# Patient Record
Sex: Female | Born: 1945 | Race: Black or African American | Hispanic: No | Marital: Single | Smoking: Former smoker
Health system: Southern US, Community
[De-identification: ages and names within clinical notes are randomized; demographics above are authoritative.]

## PROBLEM LIST (undated history)

## (undated) DIAGNOSIS — M1990 Unspecified osteoarthritis, unspecified site: Secondary | ICD-10-CM

## (undated) DIAGNOSIS — E119 Type 2 diabetes mellitus without complications: Secondary | ICD-10-CM

## (undated) DIAGNOSIS — E669 Obesity, unspecified: Secondary | ICD-10-CM

## (undated) DIAGNOSIS — D649 Anemia, unspecified: Secondary | ICD-10-CM

## (undated) HISTORY — PX: GASTRIC BYPASS: SHX52

## (undated) HISTORY — PX: BREAST BIOPSY: SHX20

## (undated) HISTORY — PX: REDUCTION MAMMAPLASTY: SUR839

## (undated) HISTORY — DX: Obesity, unspecified: E66.9

## (undated) HISTORY — PX: DILATION AND CURETTAGE OF UTERUS: SHX78

## (undated) HISTORY — PX: BREAST SURGERY: SHX581

---

## 1998-12-29 ENCOUNTER — Other Ambulatory Visit: Admission: RE | Admit: 1998-12-29 | Discharge: 1998-12-29 | Payer: Self-pay | Admitting: *Deleted

## 1999-02-12 ENCOUNTER — Ambulatory Visit (HOSPITAL_COMMUNITY): Admission: RE | Admit: 1999-02-12 | Discharge: 1999-02-12 | Payer: Self-pay | Admitting: *Deleted

## 1999-10-19 ENCOUNTER — Other Ambulatory Visit: Admission: RE | Admit: 1999-10-19 | Discharge: 1999-10-19 | Payer: Self-pay | Admitting: Internal Medicine

## 1999-12-07 ENCOUNTER — Encounter: Payer: Self-pay | Admitting: Internal Medicine

## 1999-12-07 ENCOUNTER — Encounter: Admission: RE | Admit: 1999-12-07 | Discharge: 1999-12-07 | Payer: Self-pay | Admitting: Internal Medicine

## 2000-10-18 ENCOUNTER — Other Ambulatory Visit: Admission: RE | Admit: 2000-10-18 | Discharge: 2000-10-18 | Payer: Self-pay | Admitting: Internal Medicine

## 2001-03-01 ENCOUNTER — Encounter: Payer: Self-pay | Admitting: Internal Medicine

## 2001-03-01 ENCOUNTER — Encounter: Admission: RE | Admit: 2001-03-01 | Discharge: 2001-03-01 | Payer: Self-pay | Admitting: Internal Medicine

## 2002-03-05 ENCOUNTER — Encounter: Payer: Self-pay | Admitting: Internal Medicine

## 2002-03-05 ENCOUNTER — Encounter: Admission: RE | Admit: 2002-03-05 | Discharge: 2002-03-05 | Payer: Self-pay | Admitting: Internal Medicine

## 2002-11-29 ENCOUNTER — Other Ambulatory Visit: Admission: RE | Admit: 2002-11-29 | Discharge: 2002-11-29 | Payer: Self-pay | Admitting: Internal Medicine

## 2002-12-05 ENCOUNTER — Encounter: Payer: Self-pay | Admitting: Internal Medicine

## 2002-12-05 ENCOUNTER — Encounter: Admission: RE | Admit: 2002-12-05 | Discharge: 2002-12-05 | Payer: Self-pay | Admitting: Internal Medicine

## 2002-12-12 ENCOUNTER — Encounter: Payer: Self-pay | Admitting: Internal Medicine

## 2002-12-12 ENCOUNTER — Encounter: Admission: RE | Admit: 2002-12-12 | Discharge: 2002-12-12 | Payer: Self-pay | Admitting: Internal Medicine

## 2003-02-06 ENCOUNTER — Ambulatory Visit (HOSPITAL_COMMUNITY): Admission: RE | Admit: 2003-02-06 | Discharge: 2003-02-06 | Payer: Self-pay | Admitting: Internal Medicine

## 2003-02-06 ENCOUNTER — Encounter: Payer: Self-pay | Admitting: Internal Medicine

## 2003-03-11 ENCOUNTER — Encounter: Payer: Self-pay | Admitting: Internal Medicine

## 2003-03-11 ENCOUNTER — Encounter: Admission: RE | Admit: 2003-03-11 | Discharge: 2003-03-11 | Payer: Self-pay | Admitting: Internal Medicine

## 2004-02-19 ENCOUNTER — Encounter (INDEPENDENT_AMBULATORY_CARE_PROVIDER_SITE_OTHER): Payer: Self-pay | Admitting: Specialist

## 2004-02-19 ENCOUNTER — Ambulatory Visit (HOSPITAL_COMMUNITY): Admission: RE | Admit: 2004-02-19 | Discharge: 2004-02-19 | Payer: Self-pay | Admitting: Gastroenterology

## 2004-03-17 ENCOUNTER — Encounter: Admission: RE | Admit: 2004-03-17 | Discharge: 2004-03-17 | Payer: Self-pay | Admitting: Internal Medicine

## 2004-04-16 ENCOUNTER — Encounter: Admission: RE | Admit: 2004-04-16 | Discharge: 2004-04-16 | Payer: Self-pay | Admitting: Internal Medicine

## 2004-04-26 ENCOUNTER — Encounter: Admission: RE | Admit: 2004-04-26 | Discharge: 2004-04-26 | Payer: Self-pay | Admitting: Internal Medicine

## 2005-01-18 ENCOUNTER — Other Ambulatory Visit: Admission: RE | Admit: 2005-01-18 | Discharge: 2005-01-18 | Payer: Self-pay | Admitting: Internal Medicine

## 2005-03-25 ENCOUNTER — Encounter: Admission: RE | Admit: 2005-03-25 | Discharge: 2005-03-25 | Payer: Self-pay | Admitting: Internal Medicine

## 2006-05-26 ENCOUNTER — Encounter: Admission: RE | Admit: 2006-05-26 | Discharge: 2006-05-26 | Payer: Self-pay | Admitting: Internal Medicine

## 2007-02-13 ENCOUNTER — Other Ambulatory Visit: Admission: RE | Admit: 2007-02-13 | Discharge: 2007-02-13 | Payer: Self-pay | Admitting: Internal Medicine

## 2007-03-17 ENCOUNTER — Emergency Department (HOSPITAL_COMMUNITY): Admission: EM | Admit: 2007-03-17 | Discharge: 2007-03-18 | Payer: Self-pay | Admitting: Emergency Medicine

## 2007-06-07 ENCOUNTER — Ambulatory Visit (HOSPITAL_BASED_OUTPATIENT_CLINIC_OR_DEPARTMENT_OTHER): Admission: RE | Admit: 2007-06-07 | Discharge: 2007-06-07 | Payer: Self-pay | Admitting: Surgery

## 2007-06-08 ENCOUNTER — Ambulatory Visit (HOSPITAL_COMMUNITY): Admission: RE | Admit: 2007-06-08 | Discharge: 2007-06-08 | Payer: Self-pay | Admitting: Surgery

## 2007-06-13 ENCOUNTER — Ambulatory Visit (HOSPITAL_COMMUNITY): Admission: RE | Admit: 2007-06-13 | Discharge: 2007-06-13 | Payer: Self-pay | Admitting: Surgery

## 2007-06-14 ENCOUNTER — Ambulatory Visit: Payer: Self-pay | Admitting: Internal Medicine

## 2007-06-18 ENCOUNTER — Encounter: Admission: RE | Admit: 2007-06-18 | Discharge: 2007-06-18 | Payer: Self-pay | Admitting: Surgery

## 2007-08-23 ENCOUNTER — Encounter: Admission: RE | Admit: 2007-08-23 | Discharge: 2007-09-19 | Payer: Self-pay | Admitting: Surgery

## 2007-09-11 ENCOUNTER — Inpatient Hospital Stay (HOSPITAL_COMMUNITY): Admission: RE | Admit: 2007-09-11 | Discharge: 2007-09-13 | Payer: Self-pay | Admitting: Surgery

## 2007-09-12 ENCOUNTER — Ambulatory Visit: Payer: Self-pay | Admitting: Vascular Surgery

## 2007-09-12 ENCOUNTER — Encounter (INDEPENDENT_AMBULATORY_CARE_PROVIDER_SITE_OTHER): Payer: Self-pay | Admitting: Surgery

## 2007-11-14 ENCOUNTER — Encounter: Admission: RE | Admit: 2007-11-14 | Discharge: 2007-11-14 | Payer: Self-pay | Admitting: Surgery

## 2008-06-24 ENCOUNTER — Ambulatory Visit (HOSPITAL_COMMUNITY): Admission: RE | Admit: 2008-06-24 | Discharge: 2008-06-24 | Payer: Self-pay | Admitting: Internal Medicine

## 2009-04-15 ENCOUNTER — Other Ambulatory Visit: Admission: RE | Admit: 2009-04-15 | Discharge: 2009-04-15 | Payer: Self-pay | Admitting: Internal Medicine

## 2009-06-25 ENCOUNTER — Ambulatory Visit (HOSPITAL_COMMUNITY): Admission: RE | Admit: 2009-06-25 | Discharge: 2009-06-25 | Payer: Self-pay | Admitting: Internal Medicine

## 2010-06-28 ENCOUNTER — Ambulatory Visit (HOSPITAL_COMMUNITY): Admission: RE | Admit: 2010-06-28 | Discharge: 2010-06-28 | Payer: Self-pay | Admitting: Internal Medicine

## 2010-09-27 ENCOUNTER — Encounter: Payer: Self-pay | Admitting: Internal Medicine

## 2011-01-18 NOTE — Op Note (Signed)
Barbara Mcdonald, Barbara Mcdonald               ACCOUNT NO.:  [id number]   MEDICAL RECORD NO.:  [id number]          PATIENT TYPE:  INP   LOCATION:  1540                         FACILITY:  [facility]   PHYSICIAN:  Sharlet Salina T. Hoxworth, M.D.DATE OF BIRTH:  11-Oct-1945   DATE OF PROCEDURE:  09/11/2007  DATE OF DISCHARGE:                               OPERATIVE REPORT   PROCEDURE:  Upper GI endoscopy.   DESCRIPTION OF PROCEDURE:  Upper GI endoscopy is performed  intraoperatively at the completion of laparoscopic Roux-En-Y gastric  bypass by Dr. Daphine Deutscher.  With Dr. Daphine Deutscher clamping the gastric outlet, the  Olympus video endoscope was inserted in the upper esophagus and passed  under direct vision to the EG junction at 39 cm.  The esophagus appeared  normal.  There was about a 2 cm hiatal hernia.  The small gastric pouch  was entered and then was tensely distended with air.  With the  anastomosis under saline irrigation, there was no evidence of leak.  The  anastomosis was patent.  The suture staple lines appeared intact without  bleeding.  The gastric mucosa appeared normal.  The pouch measured  between 4-5 cm in length.  The pouch was then deflated, the scope  removed, the patient tolerated the procedure well.      Lorne Skeens. Hoxworth, M.D.  Electronically Signed     BTH/MEDQ  D:  09/11/2007  T:  09/11/2007  Job:  [id number]

## 2011-01-18 NOTE — Op Note (Signed)
Barbara Mcdonald, Barbara Mcdonald               ACCOUNT NO.:  [id number]   MEDICAL RECORD NO.:  [id number]          PATIENT TYPE:  INP   LOCATION:  1540                         FACILITY:  [facility]   PHYSICIAN:  Thornton Park. Daphine Deutscher, MD  DATE OF BIRTH:  1946-03-12   DATE OF PROCEDURE:  09/11/2007  DATE OF DISCHARGE:                               OPERATIVE REPORT   PREOPERATIVE DIAGNOSIS:  Morbid obesity, BMI approximately 45 with  underlying insulin dependent diabetes and sickle cell trait.   POSTOPERATIVE DIAGNOSIS:  Morbid obesity, BMI approximately 45 with  underlying insulin dependent diabetes and sickle cell trait.   PROCEDURE:  Laparoscopic Roux-En-Y gastric bypass with 100 cm Roux limb,  40 cm BP limb, and an anticolic candy cane left anastomosis with closure  of Peterson's defect.   SURGEON:  Thornton Park. Daphine Deutscher, M.D.   ASSISTANT:  Sharlet Salina T. Hoxworth, M.D.   ANESTHESIA:  General endotracheal.   DESCRIPTION OF PROCEDURE:  This 65 year old black female was taken to  room 1 on September 11, 2007, and given general anesthesia.  The abdomen  was prepped with TechniCare and draped sterilely.  Access to the abdomen  was then gained through the left upper quadrant with the OptiVu approach  without difficulty and the abdomen was insufflated and then five other  trocars were placed in a standard position including a 5 mm in the upper  midline, two 10/11 on the right, and one slightly to the left of the  midline where the camera was used.  Also, another 5 was placed laterally  on the left.   The ligament of Treitz was first identified and I measured 40 cm  distally and divided it with a single application of the Endo-GIA white  cartridge 6 cm.  I used the Cavidentin stapling device throughout.  I  then sewed a Penrose drain on the Roux limb end of this and measured 1  meter and then completed a side-to-side anastomosis, first lying the  bowel together and then putting a stitch holding them together.   I then  opened the bowel with a harmonic scalpel and inserted the 4.5 cm white  load, fired it, and then closed the common defect with two 2-0 Vicryls  from either end using the Endostitch device.  The defect in the  mesentery was then closed with a running 2-0 silk including  incorporating it with an anti-obstruction stitch.  The lap tie was used  on the other end.   I then divided the omentum and then put in the upper midline retractor.  I went up and created a pouch with multiple firings of the Endo-GIA  crossing up and completing a nice small pouch.  The Roux limb was then  brought up and a back row was placed with running 2-0 Vicryl.  Common  openings were made in both and the stapler was fired.  A nice  anastomosis was present.  The common defect was closed again from either  end with 2-0 Vicryls tied in the middle.  A second layer of 2-0 Vicryl  was used to complete the anastomosis.  I then closed Peterson's with a  figure-of-eight suture of 2-0 silk binding the mesentery of the Roux  limb down to the tenia of the mesentery of the transverse colon which I  could see fairly easily.   I then clamped off the outflow and Dr. Johna Sheriff performed an upper  endoscopy which showed no evidence of bleeding and no evidence of any  bubbles or leak.  I then took out the rest of the fluid that I used  irrigation, applied  Tisseel to the proximal anastomosis.  Previously, I had applied Tisseel  to the JJ.  The abdomen was deflated and the trocar sites were injected  with Marcaine and closed with 4-0 Vicryl with staples.  The patient  tolerated the procedure and was taken to the recovery room in  satisfactory condition.      Thornton Park Daphine Deutscher, MD  Electronically Signed     MBM/MEDQ  D:  09/11/2007  T:  09/11/2007  Job:  [id number]   cc:   Theressa Millard, M.D.  Fax: [phone]

## 2011-01-18 NOTE — Procedures (Signed)
[name], [name]               ACCOUNT NO.:  [id number]   MEDICAL RECORD NO.:  [id number]          PATIENT TYPE:  OUT   LOCATION:  SLEEP CENTER                 FACILITY:  [facility]   PHYSICIAN:  Clinton D. Maple Hudson, MD, FCCP, FACPDATE OF BIRTH:  06-[birth year]   DATE OF STUDY:  06/07/2007                            NOCTURNAL POLYSOMNOGRAM   REFERRING PHYSICIAN:  Thornton Park. Daphine Deutscher, MD   INDICATION FOR STUDY:  Hypersomnia with sleep apnea.   EPWORTH SLEEPINESS SCORE:  5/24, height 5 feet 5 inches, weight 260  pounds.   MEDICATIONS:  Home medications are listed and reviewed.  Diagnostic NPSG protocol was requested.   SLEEP ARCHITECTURE:  Total sleep time 355 minutes with sleep efficiency  89%. Stage 1 was 3%, stage 2 was 76%, stage 3 was 9%, REM 12% of total  sleep time. Sleep latency 21 minutes, REM latency 66 minutes, awake  after sleep onset 24 minutes. Arousal index 6.1. No bedtime medication  was reported.   RESPIRATORY DATA:  Diagnostic NPSG protocol. Apnea-hypopnea index (AHI,  RDI) 27.7 obstructive events per hour indicating moderate obstructive  sleep apnea/hypopnea syndrome. All obstructive events were hypopneas  totaling 164. Events were not positional. REM AHI 55.9. Protocol  criteria were not met for emergency initiation of CPAP during this  study.   OXYGEN DATA:  Moderate to loud snoring with oxygen desaturation to a  nadir of 78%. Mean oxygen saturation through the study was 94% on room  air.   CARDIAC DATA:  Normal sinus rhythm.   MOVEMENT-PARASOMNIA:  Occasional limb jerk with little effect on sleep.  The patient slept on a wedge because of esophageal reflux.   IMPRESSIONS-RECOMMENDATIONS:  1. Moderate obstructive sleep apnea/hypopnea syndrome, AHI 27.7      obstructive events per hour. Nonpositional. Moderate to loud      snoring with oxygen desaturation to a nadir of 78%.  2. The requested study protocol was followed. This patient would meet      criteria to  return for CPAP titration if clinically appropriate      otherwise consider alternative therapies. Weight loss is likely to      be helpful. If surgery is      anticipated suggest using either enteric CPAP at 10 CWP or auto-      titration. She was fitted for a small ComfortGel nasal mask at the      beginning of this study but was not titrated to CPAP as noted      above.      Clinton D. Maple Hudson, MD, FCCP, FACP  Diplomate, Biomedical engineer of Sleep Medicine  Electronically Signed     CDY/MEDQ  D:  06/14/2007 16:54:06  T:  06/15/2007 09:53:53  Job:  [id number]

## 2011-01-21 NOTE — Op Note (Signed)
NAME:  Barbara Mcdonald, Barbara Mcdonald                         ACCOUNT NO.:  [id number]   MEDICAL RECORD NO.:  [id number]                   PATIENT TYPE:  AMB   LOCATION:  ENDO                                 FACILITY:  [facility]   PHYSICIAN:  Danise Edge, M.D.                DATE OF BIRTH:  11-Jun-1946   DATE OF PROCEDURE:  02/19/2004  DATE OF DISCHARGE:                                 OPERATIVE REPORT   PROCEDURE:  Screening colonoscopy.   INDICATIONS:  Mrs. Kynlea Blackston is a 65 year old female, born February 26, 1946.  Mrs. Goodie underwent a health maintenance flexible  proctosigmoidoscopy which was normal in 2000.  Her older sister has  undergone colonoscopic exams to remove colon polyps.  Mrs. Palos is  scheduled to undergo her first screening colonoscopy with polypectomy to  prevent colon cancer.   ENDOSCOPIST:  Danise Edge, M.D.   PREMEDICATION:  Versed 5 mg, Demerol 50 mg.   DESCRIPTION OF PROCEDURE:  After obtaining informed consent, Mrs. Shands was  placed in the left lateral decubitus position.  I administered intravenous  Demerol and intravenous Versed to achieve conscious sedation for the  procedure.  The patient's blood pressure, oxygen saturation and cardiac  rhythm were monitored throughout the procedure and documented in the medical  record.   Anal inspection and digital rectal exam were normal.  The Olympus adjustable  pediatric colonoscope was introduced into the rectum and advanced to the  cecum.  Colonic preparation for the exam today was satisfactory.   Rectum:  Normal.  Sigmoid colon and descending colon:  At 45 cm from the anal verge, a 2 mm  sessile polyp was removed with the electrocautery snare.  Splenic flexure:  Normal.  Transverse colon:  Normal.  Hepatic flexure:  Normal.  Ascending colon:  Normal.  Cecum and ileocecal valve:  Normal.   ASSESSMENT:  A small polyp was removed from the left colon at 45 cm from the  anal verge.  Otherwise normal screening  proctocolonoscopy to the cecum.                                               Danise Edge, M.D.    MJ/MEDQ  D:  02/19/2004  T:  02/19/2004  Job:  [id number]   cc:   Theressa Millard, M.D.  [address] [place]  [place] [address]  Fax: [phone]

## 2011-05-19 ENCOUNTER — Other Ambulatory Visit: Payer: Self-pay | Admitting: Internal Medicine

## 2011-05-19 ENCOUNTER — Other Ambulatory Visit (HOSPITAL_COMMUNITY)
Admission: RE | Admit: 2011-05-19 | Discharge: 2011-05-19 | Disposition: A | Discharge status: Home / Self Care | Payer: 59 | Source: Ambulatory Visit | Attending: Internal Medicine | Admitting: Internal Medicine

## 2011-05-19 DIAGNOSIS — Z1159 Encounter for screening for other viral diseases: Secondary | ICD-10-CM | POA: Insufficient documentation

## 2011-05-19 DIAGNOSIS — Z01419 Encounter for gynecological examination (general) (routine) without abnormal findings: Secondary | ICD-10-CM | POA: Insufficient documentation

## 2011-05-26 LAB — CBC
HCT: 34.5 — ABNORMAL LOW
Hemoglobin: 11.2 — ABNORMAL LOW
Hemoglobin: 11.3 — ABNORMAL LOW
MCHC: 32.4
MCHC: 32.6
MCV: 67.7 — ABNORMAL LOW
RBC: 5.1
RBC: 5.12 — ABNORMAL HIGH
RDW: 14.7
WBC: 7.8

## 2011-05-26 LAB — DIFFERENTIAL
Basophils Absolute: 0
Basophils Absolute: 0
Basophils Relative: 0
Basophils Relative: 0
Eosinophils Absolute: 0
Eosinophils Absolute: 0.1
Eosinophils Relative: 0
Eosinophils Relative: 1
Lymphocytes Relative: 16
Lymphocytes Relative: 25
Lymphs Abs: 1.2
Lymphs Abs: 2
Monocytes Absolute: 0.6
Monocytes Absolute: 0.9
Monocytes Relative: 11
Monocytes Relative: 8
Neutro Abs: 5.1
Neutro Abs: 6
Neutrophils Relative %: 63
Neutrophils Relative %: 76

## 2011-05-26 LAB — PREGNANCY, URINE: Preg Test, Ur: NEGATIVE

## 2011-05-26 LAB — HEMOGLOBIN AND HEMATOCRIT, BLOOD
HCT: 35.4 — ABNORMAL LOW
Hemoglobin: 11.4 — ABNORMAL LOW

## 2011-06-06 ENCOUNTER — Other Ambulatory Visit (HOSPITAL_COMMUNITY): Payer: Self-pay | Admitting: Internal Medicine

## 2011-06-06 DIAGNOSIS — Z1231 Encounter for screening mammogram for malignant neoplasm of breast: Secondary | ICD-10-CM

## 2011-06-10 LAB — URINALYSIS, ROUTINE W REFLEX MICROSCOPIC
Bilirubin Urine: NEGATIVE
Hgb urine dipstick: NEGATIVE
Nitrite: NEGATIVE
Specific Gravity, Urine: 1.016
Urobilinogen, UA: 0.2
pH: 5.5

## 2011-06-10 LAB — URINE MICROSCOPIC-ADD ON

## 2011-06-10 LAB — BASIC METABOLIC PANEL
Chloride: 104
Creatinine, Ser: 1.04
Glucose, Bld: 96

## 2011-06-10 LAB — HEMOGLOBIN AND HEMATOCRIT, BLOOD: HCT: 35.8 — ABNORMAL LOW

## 2011-06-21 LAB — DIFFERENTIAL
Basophils Relative: 0
Eosinophils Relative: 0
Lymphocytes Relative: 14
Lymphs Abs: 1.2
Monocytes Relative: 10
Neutro Abs: 6.8

## 2011-06-21 LAB — CBC
HCT: 40.6
MCV: 68.7 — ABNORMAL LOW
Platelets: 352
RDW: 13.8
WBC: 8.9

## 2011-06-21 LAB — URINALYSIS, ROUTINE W REFLEX MICROSCOPIC
Bilirubin Urine: NEGATIVE
Glucose, UA: >1000 — AB
Specific Gravity, Urine: 1.023
Urobilinogen, UA: 0.2
pH: 5.5

## 2011-06-21 LAB — URINE MICROSCOPIC-ADD ON

## 2011-06-21 LAB — COMPREHENSIVE METABOLIC PANEL
AST: 57 — ABNORMAL HIGH
Albumin: 3.7
BUN: 20
Chloride: 101
Creatinine, Ser: 1.71 — ABNORMAL HIGH
GFR calc Af Amer: 37 — ABNORMAL LOW
Total Protein: 8.6 — ABNORMAL HIGH

## 2011-07-01 ENCOUNTER — Ambulatory Visit (HOSPITAL_COMMUNITY)
Admission: RE | Admit: 2011-07-01 | Discharge: 2011-07-01 | Disposition: A | Discharge status: Home / Self Care | Payer: 59 | Source: Ambulatory Visit | Attending: Internal Medicine | Admitting: Internal Medicine

## 2011-07-01 DIAGNOSIS — Z1231 Encounter for screening mammogram for malignant neoplasm of breast: Secondary | ICD-10-CM

## 2012-05-30 ENCOUNTER — Other Ambulatory Visit (HOSPITAL_COMMUNITY): Payer: Self-pay | Admitting: Internal Medicine

## 2012-05-30 DIAGNOSIS — Z1231 Encounter for screening mammogram for malignant neoplasm of breast: Secondary | ICD-10-CM

## 2012-07-02 ENCOUNTER — Ambulatory Visit (HOSPITAL_COMMUNITY)
Admission: RE | Admit: 2012-07-02 | Discharge: 2012-07-02 | Disposition: A | Discharge status: Home / Self Care | Payer: 59 | Source: Ambulatory Visit | Attending: Internal Medicine | Admitting: Internal Medicine

## 2012-07-02 DIAGNOSIS — Z1231 Encounter for screening mammogram for malignant neoplasm of breast: Secondary | ICD-10-CM

## 2012-07-04 ENCOUNTER — Other Ambulatory Visit: Payer: Self-pay | Admitting: Internal Medicine

## 2012-07-04 DIAGNOSIS — R928 Other abnormal and inconclusive findings on diagnostic imaging of breast: Secondary | ICD-10-CM

## 2012-07-10 ENCOUNTER — Ambulatory Visit
Admission: RE | Admit: 2012-07-10 | Discharge: 2012-07-10 | Disposition: A | Discharge status: Home / Self Care | Payer: 59 | Source: Ambulatory Visit | Attending: Internal Medicine | Admitting: Internal Medicine

## 2012-07-10 DIAGNOSIS — R928 Other abnormal and inconclusive findings on diagnostic imaging of breast: Secondary | ICD-10-CM

## 2012-12-13 ENCOUNTER — Other Ambulatory Visit: Payer: Self-pay | Admitting: Internal Medicine

## 2012-12-13 DIAGNOSIS — R921 Mammographic calcification found on diagnostic imaging of breast: Secondary | ICD-10-CM

## 2013-01-11 ENCOUNTER — Ambulatory Visit
Admission: RE | Admit: 2013-01-11 | Discharge: 2013-01-11 | Disposition: A | Discharge status: Home / Self Care | Payer: 59 | Source: Ambulatory Visit | Attending: Internal Medicine | Admitting: Internal Medicine

## 2013-01-11 DIAGNOSIS — R921 Mammographic calcification found on diagnostic imaging of breast: Secondary | ICD-10-CM

## 2013-04-10 ENCOUNTER — Other Ambulatory Visit: Payer: Self-pay | Admitting: Endocrinology

## 2013-04-16 ENCOUNTER — Encounter: Payer: Self-pay | Admitting: Endocrinology

## 2013-04-16 ENCOUNTER — Ambulatory Visit (INDEPENDENT_AMBULATORY_CARE_PROVIDER_SITE_OTHER): Payer: Medicare Other | Admitting: Endocrinology

## 2013-04-16 VITALS — BP 118/78 | HR 67 | Resp 12 | Ht 64.0 in | Wt 212.4 lb

## 2013-04-16 DIAGNOSIS — E119 Type 2 diabetes mellitus without complications: Secondary | ICD-10-CM

## 2013-04-16 DIAGNOSIS — E7800 Pure hypercholesterolemia, unspecified: Secondary | ICD-10-CM

## 2013-04-16 LAB — COMPREHENSIVE METABOLIC PANEL
ALT: 20 U/L (ref 0–35)
AST: 21 U/L (ref 0–37)
Calcium: 10.2 mg/dL (ref 8.4–10.5)
Chloride: 107 mEq/L (ref 96–112)
Creatinine, Ser: 1.2 mg/dL (ref 0.4–1.2)
Sodium: 144 mEq/L (ref 135–145)
Total Bilirubin: 0.5 mg/dL (ref 0.3–1.2)
Total Protein: 8.2 g/dL (ref 6.0–8.3)

## 2013-04-16 LAB — LIPID PANEL
Cholesterol: 149 mg/dL (ref 0–200)
LDL Cholesterol: 78 mg/dL (ref 0–99)

## 2013-04-16 LAB — MICROALBUMIN / CREATININE URINE RATIO
Creatinine,U: 161.5 mg/dL
Microalb Creat Ratio: 1.2 mg/g (ref 0.0–30.0)

## 2013-04-16 LAB — HEMOGLOBIN A1C: Hgb A1c MFr Bld: 5.6 % (ref 4.6–6.5)

## 2013-04-16 NOTE — Progress Notes (Signed)
Patient ID: Barbara Mcdonald, female   DOB: 02/04/1946, 67 y.o.   MRN: [id number]  Barbara Mcdonald is an 67 y.o. female.   Reason for Appointment: Diabetes follow-up   History of Present Illness   Diagnosis: Type 2 DIABETES MELITUS  She has had mild diabetes for several years which has been very well controlled after her gastric bypass surgery She has not been successful in keeping all of her weight loss after the surgery and had regained significant amount She did do better with maintaining her weight with starting Victoza and she feels it helps portion control Her A1c tends to be relatively higher than expected for blood sugars, recent records not available  More recently she has not been checking her blood sugar at all since she has been busy taking care of grandchildren She has only one blood sugar this morning of 88 with her One Touch meter     Meals:  small meals during the day       Physical activity: exercise: 2-3/7 days with walking or water aerobics            Wt Readings from Last 3 Encounters:  04/16/13 212 lb 6.4 oz (96.344 kg)      Medication List       This list is accurate as of: 04/16/13 11:59 PM.  Always use your most recent med list.               metFORMIN 1000 MG tablet  Commonly known as:  GLUCOPHAGE  Take 750 mg by mouth 2 (two) times daily with a meal.     simvastatin 20 MG tablet  Commonly known as:  ZOCOR  Take 20 mg by mouth every evening.     VICTOZA 18 MG/3ML Sopn  Generic drug:  Liraglutide  INJECT 1.2 MG UNDER THE SKIN DAILY        Allergies:  Allergies  Allergen Reactions  . Aspirin     No past medical history on file.  Past Surgical History  Procedure Laterality Date  . Gastric bypass      No family history on file.  Social History:  reports that she has quit smoking. She does not have any smokeless tobacco history on file. Her alcohol and drug histories are not on file.  Review of Systems:  Has not been treated for  hypertension  HYPERLIPIDEMIA: The lipid abnormality consists of elevated LDL, previously well controlled with simvastatin.     Examination:   BP 118/78  Pulse 67  Resp 12  Ht 5\' 4"  (1.626 m)  Wt 212 lb 6.4 oz (96.344 kg)  BMI 36.44 kg/m2  SpO2 96%  Body mass index is 36.44 kg/(m^2).   ASSESSMENT/ PLAN::   Diabetes type 2   Patient's diabetes will be assessed today with A1c and fructosamine. Previously her A1c has been higher than expected and will check fructosamine also Encouraged her to check her blood sugars periodically especially after meals She will try to get back into her exercise program to enable weight loss  Hypercholesterolemia: Lipids to be checked today  [facility] 04/17/2013, 12:42 PM   Addendum: Labs as follows  Office Visit on 04/16/2013  Component Date Value Range Status  . Hemoglobin A1C 04/16/2013 5.6  4.6 - 6.5 % Final   Glycemic Control Guidelines for People with Diabetes:Non Diabetic:  <6%Goal of Therapy: <7%Additional Action Suggested:  >8%   . Cholesterol 04/16/2013 149  0 - 200 mg/dL Final   ATP III Classification  Desirable:  < 200 mg/dL               Borderline High:  200 - 239 mg/dL          High:  > = 454 mg/dL  . Triglycerides 04/16/2013 118.0  0.0 - 149.0 mg/dL Final   Normal:  <098 mg/dLBorderline High:  150 - 199 mg/dL  . HDL 04/16/2013 47.70  >39.00 mg/dL Final  . VLDL 11/91/4782 23.6  0.0 - 40.0 mg/dL Final  . LDL Cholesterol 04/16/2013 78  0 - 99 mg/dL Final  . Total CHOL/HDL Ratio 04/16/2013 3   Final                  Men          Women1/2 Average Risk     3.4          3.3Average Risk          5.0          4.42X Average Risk          9.6          7.13X Average Risk          15.0          11.0                      . Microalb, Ur 04/16/2013 1.9  0.0 - 1.9 mg/dL Final  . Creatinine,U 95/62/1308 161.5   Final  . Microalb Creat Ratio 04/16/2013 1.2  0.0 - 30.0 mg/g Final  . Sodium 04/16/2013 144  135 - 145 mEq/L Final  . Potassium  04/16/2013 4.6  3.5 - 5.1 mEq/L Final  . Chloride 04/16/2013 107  96 - 112 mEq/L Final  . CO2 04/16/2013 27  19 - 32 mEq/L Final  . Glucose, Bld 04/16/2013 92  70 - 99 mg/dL Final  . BUN 65/78/4696 14  6 - 23 mg/dL Final  . Creatinine, Ser 04/16/2013 1.2  0.4 - 1.2 mg/dL Final  . Total Bilirubin 04/16/2013 0.5  0.3 - 1.2 mg/dL Final  . Alkaline Phosphatase 04/16/2013 117  39 - 117 U/L Final  . AST 04/16/2013 21  0 - 37 U/L Final  . ALT 04/16/2013 20  0 - 35 U/L Final  . Total Protein 04/16/2013 8.2  6.0 - 8.3 g/dL Final  . Albumin 29/52/8413 4.0  3.5 - 5.2 g/dL Final  . Calcium 24/40/1027 10.2  8.4 - 10.5 mg/dL Final  . GFR 25/36/6440 60.88  >60.00 mL/min Final  . Fructosamine 04/16/2013 237  <285 umol/L Final   Comment:                            Variations in levels of serum proteins (albumin and immunoglobulins)                          may affect fructosamine results.

## 2013-04-16 NOTE — Patient Instructions (Signed)
Please check blood sugars at least half the time about 2 hours after any meal and as directed on waking up. Please bring blood sugar monitor to each visit  Increase exercise when able to

## 2013-04-17 ENCOUNTER — Encounter: Payer: Self-pay | Admitting: Endocrinology

## 2013-05-07 ENCOUNTER — Other Ambulatory Visit: Payer: Self-pay | Admitting: Endocrinology

## 2013-05-28 ENCOUNTER — Other Ambulatory Visit: Payer: Self-pay | Admitting: Endocrinology

## 2013-06-11 ENCOUNTER — Other Ambulatory Visit: Payer: Self-pay | Admitting: Endocrinology

## 2013-06-12 ENCOUNTER — Other Ambulatory Visit: Payer: Self-pay | Admitting: Internal Medicine

## 2013-06-12 ENCOUNTER — Other Ambulatory Visit (HOSPITAL_COMMUNITY): Payer: Self-pay | Admitting: Internal Medicine

## 2013-06-12 DIAGNOSIS — Z1231 Encounter for screening mammogram for malignant neoplasm of breast: Secondary | ICD-10-CM

## 2013-06-12 DIAGNOSIS — R921 Mammographic calcification found on diagnostic imaging of breast: Secondary | ICD-10-CM

## 2013-07-02 ENCOUNTER — Ambulatory Visit (HOSPITAL_COMMUNITY): Payer: Medicare Other

## 2013-07-15 ENCOUNTER — Ambulatory Visit
Admission: RE | Admit: 2013-07-15 | Discharge: 2013-07-15 | Disposition: A | Discharge status: Home / Self Care | Payer: Medicare Other | Source: Ambulatory Visit | Attending: Internal Medicine | Admitting: Internal Medicine

## 2013-07-15 DIAGNOSIS — R921 Mammographic calcification found on diagnostic imaging of breast: Secondary | ICD-10-CM

## 2013-09-26 ENCOUNTER — Other Ambulatory Visit: Payer: Self-pay | Admitting: Endocrinology

## 2013-10-01 ENCOUNTER — Other Ambulatory Visit: Payer: Self-pay | Admitting: Endocrinology

## 2013-10-18 ENCOUNTER — Encounter: Payer: Self-pay | Admitting: *Deleted

## 2013-10-18 LAB — HM DIABETES EYE EXAM

## 2013-10-21 ENCOUNTER — Encounter: Payer: Self-pay | Admitting: Endocrinology

## 2013-10-21 ENCOUNTER — Ambulatory Visit (INDEPENDENT_AMBULATORY_CARE_PROVIDER_SITE_OTHER): Payer: Medicare Other | Admitting: Endocrinology

## 2013-10-21 VITALS — BP 120/72 | HR 71 | Temp 98.2°F | Resp 16 | Ht 64.25 in | Wt 222.8 lb

## 2013-10-21 DIAGNOSIS — E119 Type 2 diabetes mellitus without complications: Secondary | ICD-10-CM

## 2013-10-21 DIAGNOSIS — D649 Anemia, unspecified: Secondary | ICD-10-CM

## 2013-10-21 LAB — COMPREHENSIVE METABOLIC PANEL
ALK PHOS: 110 U/L (ref 39–117)
ALT: 21 U/L (ref 0–35)
AST: 24 U/L (ref 0–37)
Albumin: 3.7 g/dL (ref 3.5–5.2)
BUN: 13 mg/dL (ref 6–23)
CO2: 29 mEq/L (ref 19–32)
Calcium: 9.6 mg/dL (ref 8.4–10.5)
Chloride: 106 mEq/L (ref 96–112)
Creatinine, Ser: 1.1 mg/dL (ref 0.4–1.2)
GFR: 65.35 mL/min (ref >60.00)
Glucose, Bld: 94 mg/dL (ref 70–99)
Potassium: 4.5 mEq/L (ref 3.5–5.1)
Sodium: 141 mEq/L (ref 135–145)
Total Bilirubin: 0.4 mg/dL (ref 0.3–1.2)
Total Protein: 7.5 g/dL (ref 6.0–8.3)

## 2013-10-21 LAB — CBC WITH DIFFERENTIAL/PLATELET
BASOS PCT: 0.6 % (ref 0.0–3.0)
Basophils Absolute: 0 10*3/uL (ref 0.0–0.1)
EOS ABS: 0.1 10*3/uL (ref 0.0–0.7)
EOS PCT: 2.8 % (ref 0.0–5.0)
HCT: 37 % (ref 36.0–46.0)
HEMOGLOBIN: 11.4 g/dL — AB (ref 12.0–15.0)
LYMPHS PCT: 46.2 % — AB (ref 12.0–46.0)
Lymphs Abs: 2.1 10*3/uL (ref 0.7–4.0)
MCHC: 30.9 g/dL (ref 30.0–36.0)
MCV: 72.4 fl — AB (ref 78.0–100.0)
MONO ABS: 0.5 10*3/uL (ref 0.1–1.0)
Monocytes Relative: 10.1 % (ref 3.0–12.0)
NEUTROS ABS: 1.8 10*3/uL (ref 1.4–7.7)
Neutrophils Relative %: 40.3 % — ABNORMAL LOW (ref 43.0–77.0)
Platelets: 306 10*3/uL (ref 150.0–400.0)
RBC: 5.11 Mil/uL (ref 3.87–5.11)
RDW: 14.1 % (ref 11.5–14.6)
WBC: 4.5 10*3/uL (ref 4.5–10.5)

## 2013-10-21 LAB — MICROALBUMIN / CREATININE URINE RATIO
CREATININE, U: 104.3 mg/dL
MICROALB/CREAT RATIO: 0.7 mg/g (ref 0.0–30.0)
Microalb, Ur: 0.7 mg/dL (ref 0.0–1.9)

## 2013-10-21 LAB — HEMOGLOBIN A1C: Hgb A1c MFr Bld: 6 % (ref 4.6–6.5)

## 2013-10-21 NOTE — Patient Instructions (Signed)
Resume diet. Exercise 5/7 days

## 2013-10-21 NOTE — Progress Notes (Signed)
Patient ID: Barbara Mcdonald, female   DOB: 1945/12/17, 68 y.o.   MRN: [id number]   Reason for Appointment: Diabetes follow-up   History of Present Illness   Diagnosis: Type 2 DIABETES MELITUS  She has had mild diabetes for several years which has been  well controlled after her gastric bypass surgery She has not been successful in maintaining her weight loss after the surgery and had regained significant amount She did do better with maintaining her weight with starting Victoza previously Her A1c tends to be relatively higher than expected for blood sugars  Recent history: Since her last visit she stopped taking Victoza because of cost and her weight has gone up However she believes that Victoza was not making any difference in her portion control. She is however not watching her diet as well Taking only metformin at this time More recently she has not been checking her blood sugar regularly Recent blood sugar ranged 89-127 at various times, using One Touch monitor     Meals: eating out  more frequently    Physical activity: exercise: 2-3/7 days with walking or water aerobics            Wt Readings from Last 3 Encounters:  10/21/13 222 lb 12.8 oz (101.061 kg)  04/16/13 212 lb 6.4 oz (96.344 kg)   Lab Results  Component Value Date   HGBA1C 5.6 04/16/2013   Lab Results  Component Value Date   MICROALBUR 1.9 04/16/2013   LDLCALC 78 04/16/2013   CREATININE 1.2 04/16/2013      Medication List       This list is accurate as of: 10/21/13 10:09 AM.  Always use your most recent med list.               metFORMIN 750 MG 24 hr tablet  Commonly known as:  GLUCOPHAGE-XR  TAKE 2 TABLETS BY MOUTH EVERY DAY     simvastatin 20 MG tablet  Commonly known as:  ZOCOR  TAKE 1 TABLET BY MOUTH DAILY        Allergies:  Allergies  Allergen Reactions  . Aspirin     No past medical history on file.  Past Surgical History  Procedure Laterality Date  . Gastric bypass      No family  history on file.  Social History:  reports that she has quit smoking. She does not have any smokeless tobacco history on file. Her alcohol and drug histories are not on file.  Review of Systems:  Has not been treated for hypertension  HYPERLIPIDEMIA: The lipid abnormality consists of elevated LDL, treated with simvastatin.  Lab Results  Component Value Date   CHOL 149 04/16/2013   HDL 47.70 04/16/2013   LDLCALC 78 04/16/2013   TRIG 118.0 04/16/2013   CHOLHDL 3 04/16/2013    Last eye exam: 2/15  Has mild chronic anemia possibly from thalassemia trait     Examination:   BP 120/72  Pulse 71  Temp(Src) 98.2 F (36.8 C)  Resp 16  Ht 5' 4.25" (1.632 m)  Wt 222 lb 12.8 oz (101.061 kg)  BMI 37.94 kg/m2  SpO2 97%  Body mass index is 37.94 kg/(m^2).   ASSESSMENT/ PLAN:   Diabetes type 2   Although home blood sugars are looking fairly good she has gained 10 pounds from a poor diet and going of Victoza Also inconsistent with exercise  Patient's diabetes will be assessed today with A1c  Previously her A1c has been higher than expected and  will check fructosamine also Discussed needing to get back into her diet and watching her meals especially when eating out She agrees to see the dietitian also Encouraged her to check her blood sugars periodically especially after meals She will try to get back into her exercise program to enable weight loss  Hypercholesterolemia: Lipids to be checked annually  [facility] 10/21/2013, 10:09 AM   Addendum: Labs as follows, A1c 6%  Office Visit on 10/21/2013  Component Date Value Ref Range Status  . Hemoglobin A1C 10/21/2013 6.0  4.6 - 6.5 % Final   Glycemic Control Guidelines for People with Diabetes:Non Diabetic:  <6%Goal of Therapy: <7%Additional Action Suggested:  >8%   . Sodium 10/21/2013 141  135 - 145 mEq/L Final  . Potassium 10/21/2013 4.5  3.5 - 5.1 mEq/L Final  . Chloride 10/21/2013 106  96 - 112 mEq/L Final  . CO2 10/21/2013 29   19 - 32 mEq/L Final  . Glucose, Bld 10/21/2013 94  70 - 99 mg/dL Final  . BUN 93/79/0240 13  6 - 23 mg/dL Final  . Creatinine, Ser 10/21/2013 1.1  0.4 - 1.2 mg/dL Final  . Total Bilirubin 10/21/2013 0.4  0.3 - 1.2 mg/dL Final  . Alkaline Phosphatase 10/21/2013 110  39 - 117 U/L Final  . AST 10/21/2013 24  0 - 37 U/L Final  . ALT 10/21/2013 21  0 - 35 U/L Final  . Total Protein 10/21/2013 7.5  6.0 - 8.3 g/dL Final  . Albumin 97/35/3299 3.7  3.5 - 5.2 g/dL Final  . Calcium 24/26/8341 9.6  8.4 - 10.5 mg/dL Final  . GFR 96/22/2979 65.35  >60.00 mL/min Final  . WBC 10/21/2013 4.5  4.5 - 10.5 K/uL Final  . RBC 10/21/2013 5.11  3.87 - 5.11 Mil/uL Final  . Hemoglobin 10/21/2013 11.4* 12.0 - 15.0 g/dL Final  . HCT 89/21/1941 37.0  36.0 - 46.0 % Final  . MCV 10/21/2013 72.4* 78.0 - 100.0 fl Final  . MCHC 10/21/2013 30.9  30.0 - 36.0 g/dL Final  . RDW 74/04/1447 14.1  11.5 - 14.6 % Final  . Platelets 10/21/2013 306.0  150.0 - 400.0 K/uL Final  . Neutrophils Relative % 10/21/2013 40.3* 43.0 - 77.0 % Final  . Lymphocytes Relative 10/21/2013 46.2* 12.0 - 46.0 % Final  . Monocytes Relative 10/21/2013 10.1  3.0 - 12.0 % Final  . Eosinophils Relative 10/21/2013 2.8  0.0 - 5.0 % Final  . Basophils Relative 10/21/2013 0.6  0.0 - 3.0 % Final  . Neutro Abs 10/21/2013 1.8  1.4 - 7.7 K/uL Final  . Lymphs Abs 10/21/2013 2.1  0.7 - 4.0 K/uL Final  . Monocytes Absolute 10/21/2013 0.5  0.1 - 1.0 K/uL Final  . Eosinophils Absolute 10/21/2013 0.1  0.0 - 0.7 K/uL Final  . Basophils Absolute 10/21/2013 0.0  0.0 - 0.1 K/uL Final  . Microalb, Ur 10/21/2013 0.7  0.0 - 1.9 mg/dL Final  . Creatinine,U 18/56/3149 104.3   Final  . Microalb Creat Ratio 10/21/2013 0.7  0.0 - 30.0 mg/g Final  Abstract on 10/18/2013  Component Date Value Ref Range Status  . HM Diabetic Eye Exam 10/18/2013 No DR Noted   Final

## 2013-11-02 ENCOUNTER — Other Ambulatory Visit: Payer: Self-pay | Admitting: Endocrinology

## 2013-12-04 ENCOUNTER — Encounter: Payer: Medicare Other | Attending: Endocrinology | Admitting: Dietician

## 2013-12-04 ENCOUNTER — Encounter: Payer: Self-pay | Admitting: Dietician

## 2013-12-04 VITALS — Ht 64.5 in | Wt 219.4 lb

## 2013-12-04 DIAGNOSIS — E669 Obesity, unspecified: Secondary | ICD-10-CM | POA: Insufficient documentation

## 2013-12-04 DIAGNOSIS — Z713 Dietary counseling and surveillance: Secondary | ICD-10-CM | POA: Insufficient documentation

## 2013-12-04 NOTE — Patient Instructions (Signed)
Look to exercise in the water 3 x week. (Then add 2 more days on the elliptical/treadmill). Eat protein foods first (tuna, yogurt, chicken, lunch meat, egg) then non starchy vegetables. Limit carbohydrates to 15 grams per meal or snack. If you are craving something sweet, try a protein shake or yogurt (less than 12 gram carbs per serving). Think about buying the pre-washed or microwable vegetables.

## 2013-12-04 NOTE — Progress Notes (Signed)
  Medical Nutrition Therapy:  Appt start time: 0945 end time:  1045.   Assessment:  Primary concerns today: Barbara Mcdonald is here today since she feels like her diet has gone "way off" and she would like to get back on track. Had RYGB about 7 years ago. Weight was stable for several years about surgery (165 lbs) and starting add more foods after started doing exercise. Was swimming and doing water exercise until about a year ago and now exercising 1-2 per week.   Would like to lose about 50 lbs in total and would be happy to lose 10 lbs at first. Does not like to cook and is currently retired and lives by herself. Eats a lot meals from restaurants.   Preferred Learning Style:   No preference indicated   Learning Readiness:   Ready   MEDICATIONS: see list   DIETARY INTAKE:  Avoided foods include: beans, cottage cheese, brussels sprouts   24-hr recall:  B ( 10 AM): 1 egg, 1 piece of toast and 3 strips of bacon with coffee with splenda Snk ( AM): none  L ( 2 PM): chinese food or sandwich  Snk ( PM): "whatever is around the house"  D ( PM): restaurants meal such as [employer], [employer], [employer] (1/2 at lunch, 1/2 dinner foot long) Snk ( PM): sweets  Beverages: mostly water, diet soda, or flavored water (crystal light)  Usual physical activity: [facility] x [facility], elliptical   Estimated energy needs: 1200 calories 135 g carbohydrates 90 g protein 33 g fat  Progress Towards Goal(s):  In progress.   Nutritional Diagnosis:  [place]-3.3 Overweight/obesity As related to excess carbohydrate consumption.  As evidenced by 50 lb weight gain after RYGB.    Intervention:  Nutrition counseling provided. Plan: Look to exercise in the water 3 x week. (Then add 2 more days on the elliptical/treadmill). Eat protein foods first (tuna, yogurt, chicken, lunch meat, egg) then non starchy vegetables. Limit carbohydrates to 15 grams per meal or snack. If you are craving something sweet, try a protein shake  or yogurt (less than 12 gram carbs per serving). Think about buying the pre-washed or microwable vegetables.    Teaching Method Utilized:  Visual Auditory Hands on  Handouts given during visit include:  Specialized Post Bariatric Diet  15 g CHO Snacks  Yellow Card  Barriers to learning/adherence to lifestyle change: none  Demonstrated degree of understanding via:  Teach Back   Monitoring/Evaluation:  Dietary intake, exercise, and body weight prn.

## 2013-12-11 ENCOUNTER — Other Ambulatory Visit: Payer: Self-pay | Admitting: Internal Medicine

## 2013-12-11 DIAGNOSIS — R921 Mammographic calcification found on diagnostic imaging of breast: Secondary | ICD-10-CM

## 2014-01-14 ENCOUNTER — Ambulatory Visit
Admission: RE | Admit: 2014-01-14 | Discharge: 2014-01-14 | Disposition: A | Discharge status: Home / Self Care | Payer: Medicare Other | Source: Ambulatory Visit | Attending: Internal Medicine | Admitting: Internal Medicine

## 2014-01-14 ENCOUNTER — Encounter (INDEPENDENT_AMBULATORY_CARE_PROVIDER_SITE_OTHER): Payer: Self-pay

## 2014-01-14 DIAGNOSIS — R921 Mammographic calcification found on diagnostic imaging of breast: Secondary | ICD-10-CM

## 2014-02-17 ENCOUNTER — Telehealth: Payer: Self-pay | Admitting: Endocrinology

## 2014-02-17 ENCOUNTER — Other Ambulatory Visit (INDEPENDENT_AMBULATORY_CARE_PROVIDER_SITE_OTHER): Payer: Medicare Other

## 2014-02-17 ENCOUNTER — Other Ambulatory Visit: Payer: Self-pay | Admitting: *Deleted

## 2014-02-17 DIAGNOSIS — E119 Type 2 diabetes mellitus without complications: Secondary | ICD-10-CM

## 2014-02-17 LAB — LIPID PANEL
Cholesterol: 132 mg/dL (ref 0–200)
HDL: 51.2 mg/dL (ref >39.00)
LDL Cholesterol: 61 mg/dL (ref 0–99)
NonHDL: 80.8
Total CHOL/HDL Ratio: 3
Triglycerides: 98 mg/dL (ref 0.0–149.0)
VLDL: 19.6 mg/dL (ref 0.0–40.0)

## 2014-02-17 LAB — HEMOGLOBIN A1C: Hgb A1c MFr Bld: 5.8 % (ref 4.6–6.5)

## 2014-02-17 LAB — BASIC METABOLIC PANEL
BUN: 14 mg/dL (ref 6–23)
CO2: 24 mEq/L (ref 19–32)
Calcium: 9.6 mg/dL (ref 8.4–10.5)
Chloride: 106 mEq/L (ref 96–112)
Creatinine, Ser: 1.2 mg/dL (ref 0.4–1.2)
GFR: 60.72 mL/min (ref >60.00)
Glucose, Bld: 113 mg/dL — ABNORMAL HIGH (ref 70–99)
POTASSIUM: 4.6 meq/L (ref 3.5–5.1)
SODIUM: 140 meq/L (ref 135–145)

## 2014-02-17 MED ORDER — GLUCOSE BLOOD VI STRP: ORAL_STRIP | Status: DC

## 2014-02-17 NOTE — Telephone Encounter (Signed)
Pt needs verio one touch test strips called in please

## 2014-02-17 NOTE — Telephone Encounter (Signed)
rx sent

## 2014-02-20 ENCOUNTER — Encounter: Payer: Self-pay | Admitting: Endocrinology

## 2014-02-20 ENCOUNTER — Ambulatory Visit (INDEPENDENT_AMBULATORY_CARE_PROVIDER_SITE_OTHER): Payer: Medicare Other | Admitting: Endocrinology

## 2014-02-20 VITALS — BP 116/72 | HR 68 | Temp 98.1°F | Resp 16 | Ht 64.25 in | Wt 222.8 lb

## 2014-02-20 DIAGNOSIS — E119 Type 2 diabetes mellitus without complications: Secondary | ICD-10-CM

## 2014-02-20 DIAGNOSIS — E7800 Pure hypercholesterolemia, unspecified: Secondary | ICD-10-CM

## 2014-02-20 NOTE — Progress Notes (Signed)
Patient ID: Barbara Mcdonald, female   DOB: 08/14/1946, 68 y.o.   MRN: [id number]   Reason for Appointment: Diabetes follow-up   History of Present Illness   Diagnosis: Type 2 DIABETES MELITUS  She has had mild diabetes for several years which has been  well controlled after her gastric bypass surgery She has not been successful in maintaining her weight loss after the surgery and had regained significant amount She did do better with maintaining her weight with starting Victoza previously Her A1c tends to be relatively higher than expected for blood sugars  Recent history: She is being treated with metformin only Previously she was on May shows a but she thinks that the Victoza was not making any difference in her portion control.  However had been gaining weight progressively which she believed was from poor diet After seeing the dietitian she has tried to change her eating habits and also cooking more at home Weight has leveled off A1c is still excellent in the upper normal range Again recently she has not been checking her blood sugar regularly Recent blood sugar ranged from 80-135 and she has only 3 readings on her download on her One Touch monitor Fasting glucose today was 116     Meals: eating out  less frequently    Physical activity: exercise:was doing walking or water aerobics            Wt Readings from Last 3 Encounters:  02/20/14 222 lb 12.8 oz (101.061 kg)  12/04/13 219 lb 6.4 oz (99.519 kg)  10/21/13 222 lb 12.8 oz (101.061 kg)   Lab Results  Component Value Date   HGBA1C 5.8 02/17/2014   HGBA1C 6.0 10/21/2013   HGBA1C 5.6 04/16/2013   Lab Results  Component Value Date   MICROALBUR 0.7 10/21/2013   LDLCALC 61 02/17/2014   CREATININE 1.2 02/17/2014      Medication List       This list is accurate as of: 02/20/14 10:32 AM.  Always use your most recent med list.               aspirin 81 MG tablet  Take 81 mg by mouth daily.     CALTRATE 600+D PO  Take by  mouth.     glucose blood test strip  Commonly known as:  ONETOUCH VERIO  Use as instructed to check blood sugars 2 times per day     IRON (FERROUS GLUCONATE) PO  Take by mouth.     metFORMIN 750 MG 24 hr tablet  Commonly known as:  GLUCOPHAGE-XR  TAKE 2 TABLETS BY MOUTH EVERY DAY     MULTIVITAMIN & MINERAL PO  Take by mouth.     OMEGA 3 PO  Take by mouth.     omeprazole 20 MG capsule  Commonly known as:  PRILOSEC  Take 20 mg by mouth daily.     simvastatin 20 MG tablet  Commonly known as:  ZOCOR  TAKE 1 TABLET BY MOUTH DAILY     vitamin B-12 1000 MCG tablet  Commonly known as:  CYANOCOBALAMIN  Take 1,000 mcg by mouth daily.        Allergies:  Allergies  Allergen Reactions  . Aspirin     Past Medical History  Diagnosis Date  . Obesity     Past Surgical History  Procedure Laterality Date  . Gastric bypass    . Breast surgery      Family History  Problem Relation Age of Onset  .  Obesity Other   . Diabetes Other     Social History:  reports that she has quit smoking. She does not have any smokeless tobacco history on file. Her alcohol and drug histories are not on file.  Review of Systems:  Has not been treated for hypertension  HYPERLIPIDEMIA: The lipid abnormality consists of elevated LDL, treated with simvastatin and well controlled.  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   ASSESSMENT/ PLAN:   Diabetes type 2   Although home blood sugars are looking fairly good  She has tried to improve her diet and has leveled off her weight gain However  inconsistent with exercise and currently has not been motivated to do any   Encouraged her to check her blood sugars periodically especially after meals She will try to get back into her exercise program to enable weight loss  Hypercholesterolemia: Lipids to be checked annually  [facility] 02/20/2014, 10:32 AM   Addendum    Appointment on 02/17/2014  Component Date Value Ref Range Status  . Hemoglobin A1C 02/17/2014 5.8  4.6 - 6.5 % Final   Glycemic Control Guidelines for People with Diabetes:Non Diabetic:  <6%Goal of Therapy: <7%Additional Action Suggested:  >8%   . Sodium 02/17/2014 140  135 - 145 mEq/L Final  . Potassium 02/17/2014 4.6  3.5 - 5.1 mEq/L Final  . Chloride 02/17/2014 106  96 - 112 mEq/L Final  . CO2 02/17/2014 24  19 - 32 mEq/L Final  . Glucose, Bld 02/17/2014 113* 70 - 99 mg/dL Final  . BUN 40/98/1191 14  6 - 23 mg/dL Final  . Creatinine, Ser 02/17/2014 1.2  0.4 - 1.2 mg/dL Final  . Calcium 47/82/9562 9.6  8.4 - 10.5 mg/dL Final  . GFR 13/04/6577 60.72  >60.00 mL/min Final  . Cholesterol 02/17/2014 132  0 - 200 mg/dL Final   ATP III Classification       Desirable:  < 200 mg/dL               Borderline High:  200 - 239 mg/dL          High:  > = 469 mg/dL  . Triglycerides 02/17/2014 98.0  0.0 - 149.0 mg/dL Final   Normal:  <629 mg/dLBorderline High:  150 - 199 mg/dL  . HDL 02/17/2014 51.20  >39.00 mg/dL Final  . VLDL 52/84/1324 19.6  0.0 - 40.0 mg/dL Final  . LDL Cholesterol 02/17/2014 61  0 - 99 mg/dL Final  . Total CHOL/HDL Ratio 02/17/2014 3   Final                  Men          Women1/2 Average Risk     3.4          3.3Average Risk          5.0          4.42X Average Risk  9.6          7.13X Average Risk          15.0          11.0                      . NonHDL 02/17/2014 80.80   Final

## 2014-03-28 ENCOUNTER — Telehealth: Payer: Self-pay | Admitting: Endocrinology

## 2014-03-28 NOTE — Telephone Encounter (Signed)
Patient stated that Sandi Mealyrriva will be sending Dr Lucianne MussKumar a form to fill out so patient can get supply's sent directly to her home.

## 2014-03-29 ENCOUNTER — Other Ambulatory Visit: Payer: Self-pay | Admitting: Endocrinology

## 2014-04-28 ENCOUNTER — Other Ambulatory Visit: Payer: Self-pay | Admitting: Endocrinology

## 2014-06-06 ENCOUNTER — Other Ambulatory Visit: Payer: Self-pay | Admitting: *Deleted

## 2014-06-06 ENCOUNTER — Telehealth: Payer: Self-pay | Admitting: Endocrinology

## 2014-06-06 MED ORDER — GLUCOSE BLOOD VI STRP: ORAL_STRIP | Status: DC

## 2014-06-06 NOTE — Telephone Encounter (Signed)
Please call in test strips for verio to [facility]

## 2014-06-13 ENCOUNTER — Telehealth: Payer: Self-pay | Admitting: Endocrinology

## 2014-06-13 ENCOUNTER — Other Ambulatory Visit: Payer: Self-pay | Admitting: *Deleted

## 2014-06-13 MED ORDER — ONETOUCH DELICA LANCETS FINE MISC: Status: DC

## 2014-06-13 NOTE — Telephone Encounter (Signed)
Please call in the lancets for the pt's diabetic supplies. One touch verio

## 2014-06-19 ENCOUNTER — Other Ambulatory Visit: Payer: Self-pay | Admitting: Internal Medicine

## 2014-06-19 DIAGNOSIS — R921 Mammographic calcification found on diagnostic imaging of breast: Secondary | ICD-10-CM

## 2014-06-30 ENCOUNTER — Ambulatory Visit
Admission: RE | Admit: 2014-06-30 | Discharge: 2014-06-30 | Disposition: A | Discharge status: Home / Self Care | Payer: Medicare Other | Source: Ambulatory Visit | Attending: Internal Medicine | Admitting: Internal Medicine

## 2014-06-30 ENCOUNTER — Encounter (INDEPENDENT_AMBULATORY_CARE_PROVIDER_SITE_OTHER): Payer: Self-pay

## 2014-06-30 DIAGNOSIS — R921 Mammographic calcification found on diagnostic imaging of breast: Secondary | ICD-10-CM

## 2014-08-03 ENCOUNTER — Other Ambulatory Visit: Payer: Self-pay | Admitting: Endocrinology

## 2014-08-07 ENCOUNTER — Other Ambulatory Visit: Payer: Self-pay | Admitting: Endocrinology

## 2014-08-19 ENCOUNTER — Other Ambulatory Visit (INDEPENDENT_AMBULATORY_CARE_PROVIDER_SITE_OTHER): Payer: Medicare Other

## 2014-08-19 DIAGNOSIS — E119 Type 2 diabetes mellitus without complications: Secondary | ICD-10-CM

## 2014-08-19 LAB — COMPREHENSIVE METABOLIC PANEL
ALT: 21 U/L (ref 0–35)
AST: 21 U/L (ref 0–37)
Albumin: 3.7 g/dL (ref 3.5–5.2)
Alkaline Phosphatase: 101 U/L (ref 39–117)
BUN: 12 mg/dL (ref 6–23)
CALCIUM: 9.4 mg/dL (ref 8.4–10.5)
CHLORIDE: 109 meq/L (ref 96–112)
CO2: 26 mEq/L (ref 19–32)
CREATININE: 1.1 mg/dL (ref 0.4–1.2)
GFR: 63.15 mL/min (ref >60.00)
GLUCOSE: 100 mg/dL — AB (ref 70–99)
POTASSIUM: 4.3 meq/L (ref 3.5–5.1)
Sodium: 139 mEq/L (ref 135–145)
Total Bilirubin: 0.2 mg/dL (ref 0.2–1.2)
Total Protein: 7.2 g/dL (ref 6.0–8.3)

## 2014-08-19 LAB — URINALYSIS, ROUTINE W REFLEX MICROSCOPIC
Bilirubin Urine: NEGATIVE
Hgb urine dipstick: NEGATIVE
KETONES UR: NEGATIVE
Leukocytes, UA: NEGATIVE
NITRITE: NEGATIVE
RBC / HPF: NONE SEEN (ref 0–?)
SPECIFIC GRAVITY, URINE: 1.02 (ref 1.000–1.030)
Total Protein, Urine: NEGATIVE
URINE GLUCOSE: NEGATIVE
Urobilinogen, UA: 0.2 (ref 0.0–1.0)
pH: 5.5 (ref 5.0–8.0)

## 2014-08-19 LAB — HEMOGLOBIN A1C: Hgb A1c MFr Bld: 6.2 % (ref 4.6–6.5)

## 2014-08-22 ENCOUNTER — Encounter: Payer: Self-pay | Admitting: Endocrinology

## 2014-08-22 ENCOUNTER — Ambulatory Visit (INDEPENDENT_AMBULATORY_CARE_PROVIDER_SITE_OTHER): Payer: Medicare Other | Admitting: Endocrinology

## 2014-08-22 VITALS — BP 133/75 | HR 70 | Temp 97.9°F | Resp 16 | Ht 64.25 in | Wt 239.8 lb

## 2014-08-22 DIAGNOSIS — E669 Obesity, unspecified: Secondary | ICD-10-CM

## 2014-08-22 DIAGNOSIS — E119 Type 2 diabetes mellitus without complications: Secondary | ICD-10-CM

## 2014-08-22 DIAGNOSIS — E1169 Type 2 diabetes mellitus with other specified complication: Secondary | ICD-10-CM

## 2014-08-22 MED ORDER — SIMVASTATIN 20 MG PO TABS: 20.0000 mg | ORAL_TABLET | Freq: Every day | ORAL | Status: DC

## 2014-08-22 MED ORDER — CANAGLIFLOZIN 100 MG PO TABS: 100.0000 mg | ORAL_TABLET | Freq: Every day | ORAL | Status: DC

## 2014-08-22 MED ORDER — METFORMIN HCL ER 750 MG PO TB24: 1500.0000 mg | ORAL_TABLET | Freq: Every day | ORAL | Status: DC

## 2014-08-22 NOTE — Patient Instructions (Signed)
Restart Exercise, at least walk daily  Please check blood sugars at least half the time about 2 hours after any meal and times per week on waking up. Please bring blood sugar monitor to each visit  Invokana in ams

## 2014-08-22 NOTE — Progress Notes (Signed)
Patient ID: Barbara Mcdonald, female   DOB: [birth year], 68 y.o.   MRN: [id number]   Reason for Appointment: Diabetes follow-up   History of Present Illness   Diagnosis: Type 2 DIABETES MELITUS  She has had mild diabetes for several years which has been  well controlled after her gastric bypass surgery She has not been successful in maintaining her weight loss after the surgery and had regained significant amount She did do better with maintaining her weight with Victoza previously Her A1c tends to be relatively higher than expected for blood sugars  Recent history: She is being treated with metformin 1500 mg only Previously she was on Victoza until May when she stopped this on her own.  She thinks that the Victoza was not making any difference in her portion control for helping her with weight loss. She was also referred to the dietitian with some changes in her diet made subsequently.  However had been gaining weight progressively since stopping Victoza  A1c is still excellent in the upper normal range although slightly higher than before Again recently she has not been checking her blood sugar regularly and has checked mostly midday and before supper time She does have a couple of significantly high readings over 200 when she did not watch her diet.     Meals: eating out  occasionally only Physical activity: exercise: was doing walking or water aerobics, none now because of taking care of friends or family members           Glucose readings from one touch download:  PRE-MEAL Breakfast Lunch  PCL   PCS  Overall  Glucose range:  117   85-105   238, 168   105-152    Median:      117     Wt Readings from Last 3 Encounters:  08/22/14 239 lb 12.8 oz (108.773 kg)  02/20/14 222 lb 12.8 oz (101.061 kg)  12/04/13 219 lb 6.4 oz (99.519 kg)   Lab Results  Component Value Date   HGBA1C 6.2 08/19/2014   HGBA1C 5.8 02/17/2014   HGBA1C 6.0 10/21/2013   Lab Results  Component Value Date   MICROALBUR 0.7 10/21/2013   LDLCALC 61 02/17/2014   CREATININE 1.1 08/19/2014      Medication List       This list is accurate as of: 08/22/14 10:38 AM.  Always use your most recent med list.               aspirin 81 MG tablet  Take 81 mg by mouth daily.     CALTRATE 600+D PO  Take by mouth.     cetirizine 5 MG tablet  Commonly known as:  ZYRTEC  Take 5 mg by mouth daily.     glucose blood test strip  Commonly known as:  ONETOUCH VERIO  Use as instructed to check blood sugars 2 times per day dx code E11.9     IRON (FERROUS GLUCONATE) PO  Take by mouth.     metFORMIN 750 MG 24 hr tablet  Commonly known as:  GLUCOPHAGE-XR  TAKE 2 TABLETS BY MOUTH DAILY     MULTIVITAMIN & MINERAL PO  Take by mouth.     OMEGA 3 PO  Take by mouth.     omeprazole 20 MG capsule  Commonly known as:  PRILOSEC  Take 20 mg by mouth daily.     ONETOUCH DELICA LANCETS FINE Misc  Use to check blood sugar 2 times per day dx  code E11.9     simvastatin 20 MG tablet  Commonly known as:  ZOCOR  TAKE 1 TABLET BY MOUTH EVERY DAY     vitamin B-12 1000 MCG tablet  Commonly known as:  CYANOCOBALAMIN  Take 1,000 mcg by mouth daily.        Allergies:  Allergies  Allergen Reactions  . Aspirin     Past Medical History  Diagnosis Date  . Obesity     Past Surgical History  Procedure Laterality Date  . Gastric bypass    . Breast surgery      Family History  Problem Relation Age of Onset  . Obesity Other   . Diabetes Other     Social History:  reports that she has quit smoking. She does not have any smokeless tobacco history on file. Her alcohol and drug histories are not on file.  Review of Systems:  Has not been treated for hypertension, blood pressure again stable  HYPERLIPIDEMIA: The lipid abnormality consists of elevated LDL, treated with simvastatin and well controlled.  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   ASSESSMENT/ PLAN:   Diabetes type 2   She is having progressive weight gain despite her history of gastric bypass surgery Although some of her weight gain is related to poor exercise regimen she is probably not watching her diet consistently She had done better with Victoza in the past but she did not want to continue an injectable drug Although home blood sugars are looking fairly good she does have sporadic high readings after certain meals when she does not watch her diet  Discussed treatment options including going back on Victoza or SGLT2 drugs She is reluctant to try other medications but will for now try 100 mg Invokana  Discussed action of SGLT 2 drugs on lowering glucose by decreasing kidney absorption of glucose, benefits of weight loss and lower blood pressure, possible side effects including candidiasis and dosage regimen    She will try to get back into her exercise program when she again to enable weight loss   Barbara Mcdonald 08/22/2014, 10:38 AM   Addendum    Appointment on 08/19/2014  Component Date Value Ref Range Status  . Color, Urine 08/19/2014 YELLOW  Yellow;Lt. Yellow Final  . APPearance 08/19/2014 CLEAR  Clear Final  . Specific Gravity, Urine 08/19/2014 1.020  1.000-1.030 Final  . pH 08/19/2014 5.5  5.0 - 8.0 Final  . Total Protein, Urine 08/19/2014 NEGATIVE  Negative Final  . Urine Glucose 08/19/2014 NEGATIVE  Negative Final  . Ketones, ur 08/19/2014 NEGATIVE  Negative Final  . Bilirubin Urine 08/19/2014 NEGATIVE  Negative Final  . Hgb urine dipstick 08/19/2014 NEGATIVE  Negative Final  .  Urobilinogen, UA 08/19/2014 0.2  0.0 - 1.0 Final  . Leukocytes, UA 08/19/2014 NEGATIVE  Negative Final  . Nitrite 08/19/2014 NEGATIVE  Negative Final  . WBC, UA 08/19/2014 0-2/hpf  0-2/hpf Final  . RBC / HPF 08/19/2014 none  seen  0-2/hpf Final  . Squamous Epithelial / LPF 08/19/2014 Rare(0-4/hpf)  Rare(0-4/hpf) Final  . Bacteria, UA 08/19/2014 Many(>50/hpf)* None Final  . Hgb A1c MFr Bld 08/19/2014 6.2  4.6 - 6.5 % Final   Glycemic Control Guidelines for People with Diabetes:Non Diabetic:  <6%Goal of Therapy: <7%Additional Action Suggested:  >8%   . Sodium 08/19/2014 139  135 - 145 mEq/L Final  . Potassium 08/19/2014 4.3  3.5 - 5.1 mEq/L Final  . Chloride 08/19/2014 109  96 - 112 mEq/L Final  . CO2 08/19/2014 26  19 - 32 mEq/L Final  . Glucose, Bld 08/19/2014 100* 70 - 99 mg/dL Final  . BUN 16/10/960412/15/2015 12  6 - 23 mg/dL Final  . Creatinine, Ser 08/19/2014 1.1  0.4 - 1.2 mg/dL Final  . Total Bilirubin 08/19/2014 0.2  0.2 - 1.2 mg/dL Final  . Alkaline Phosphatase 08/19/2014 101  39 - 117 U/L Final  . AST 08/19/2014 21  0 - 37 U/L Final  . ALT 08/19/2014 21  0 - 35 U/L Final  . Total Protein 08/19/2014 7.2  6.0 - 8.3 g/dL Final  . Albumin 54/09/811912/15/2015 3.7  3.5 - 5.2 g/dL Final  . Calcium 14/78/295612/15/2015 9.4  8.4 - 10.5 mg/dL Final  . GFR 21/30/865712/15/2015 63.15  >60.00 mL/min Final

## 2014-08-26 ENCOUNTER — Other Ambulatory Visit: Payer: Self-pay | Admitting: Endocrinology

## 2014-08-27 ENCOUNTER — Other Ambulatory Visit: Payer: Self-pay | Admitting: Gastroenterology

## 2014-09-05 ENCOUNTER — Other Ambulatory Visit: Payer: Self-pay | Admitting: Endocrinology

## 2014-09-05 HISTORY — PX: BREAST BIOPSY: SHX20

## 2014-11-03 ENCOUNTER — Other Ambulatory Visit: Payer: Self-pay | Admitting: Gastroenterology

## 2014-11-03 ENCOUNTER — Encounter (HOSPITAL_COMMUNITY): Payer: Self-pay | Admitting: *Deleted

## 2014-11-05 ENCOUNTER — Telehealth: Payer: Self-pay | Admitting: Endocrinology

## 2014-11-05 NOTE — Telephone Encounter (Signed)
[facility] CALLED NEEDING TO SPEAK TO YOU REGARDING PT MEDS , PLEASE CALL JORIE BACK AT [phone]

## 2014-11-11 ENCOUNTER — Ambulatory Visit (HOSPITAL_COMMUNITY): Payer: Medicare Other | Admitting: Anesthesiology

## 2014-11-11 ENCOUNTER — Encounter (HOSPITAL_COMMUNITY)
Admission: RE | Disposition: A | Discharge status: Home / Self Care | Payer: Self-pay | Source: Ambulatory Visit | Attending: Gastroenterology

## 2014-11-11 ENCOUNTER — Encounter (HOSPITAL_COMMUNITY): Payer: Self-pay | Admitting: Gastroenterology

## 2014-11-11 ENCOUNTER — Ambulatory Visit (HOSPITAL_COMMUNITY)
Admission: RE | Admit: 2014-11-11 | Discharge: 2014-11-11 | Disposition: A | Discharge status: Home / Self Care | Payer: Medicare Other | Source: Ambulatory Visit | Attending: Gastroenterology | Admitting: Gastroenterology

## 2014-11-11 DIAGNOSIS — Z9884 Bariatric surgery status: Secondary | ICD-10-CM | POA: Insufficient documentation

## 2014-11-11 DIAGNOSIS — Z86010 Personal history of colonic polyps: Secondary | ICD-10-CM | POA: Diagnosis not present

## 2014-11-11 DIAGNOSIS — J45909 Unspecified asthma, uncomplicated: Secondary | ICD-10-CM | POA: Diagnosis not present

## 2014-11-11 DIAGNOSIS — K219 Gastro-esophageal reflux disease without esophagitis: Secondary | ICD-10-CM | POA: Diagnosis not present

## 2014-11-11 DIAGNOSIS — Z09 Encounter for follow-up examination after completed treatment for conditions other than malignant neoplasm: Secondary | ICD-10-CM | POA: Diagnosis not present

## 2014-11-11 DIAGNOSIS — Z87891 Personal history of nicotine dependence: Secondary | ICD-10-CM | POA: Diagnosis not present

## 2014-11-11 DIAGNOSIS — E780 Pure hypercholesterolemia: Secondary | ICD-10-CM | POA: Insufficient documentation

## 2014-11-11 DIAGNOSIS — E119 Type 2 diabetes mellitus without complications: Secondary | ICD-10-CM | POA: Insufficient documentation

## 2014-11-11 HISTORY — DX: Anemia, unspecified: D64.9

## 2014-11-11 HISTORY — DX: Unspecified osteoarthritis, unspecified site: M19.90

## 2014-11-11 HISTORY — PX: COLONOSCOPY WITH PROPOFOL: SHX5780

## 2014-11-11 HISTORY — DX: Type 2 diabetes mellitus without complications: E11.9

## 2014-11-11 LAB — GLUCOSE, CAPILLARY: GLUCOSE-CAPILLARY: 105 mg/dL — AB (ref 70–99)

## 2014-11-11 SURGERY — COLONOSCOPY WITH PROPOFOL
Anesthesia: Monitor Anesthesia Care

## 2014-11-11 MED ORDER — PROPOFOL 10 MG/ML IV BOLUS
INTRAVENOUS | Status: AC
  Filled 2014-11-11: qty 20

## 2014-11-11 MED ORDER — LIDOCAINE HCL (CARDIAC) 20 MG/ML IV SOLN
INTRAVENOUS | Status: AC
  Filled 2014-11-11: qty 5

## 2014-11-11 MED ORDER — SODIUM CHLORIDE 0.9 % IV SOLN: INTRAVENOUS | Status: DC

## 2014-11-11 MED ORDER — LACTATED RINGERS IV SOLN
INTRAVENOUS | Status: DC
  Administered 2014-11-11: 1000 mL via INTRAVENOUS

## 2014-11-11 MED ORDER — PROPOFOL 10 MG/ML IV BOLUS
INTRAVENOUS | Status: DC | PRN
  Administered 2014-11-11: 50 mg via INTRAVENOUS

## 2014-11-11 MED ORDER — PROPOFOL INFUSION 10 MG/ML OPTIME
INTRAVENOUS | Status: DC | PRN
  Administered 2014-11-11: 100 ug/kg/min via INTRAVENOUS

## 2014-11-11 MED ORDER — LIDOCAINE HCL (CARDIAC) 20 MG/ML IV SOLN
INTRAVENOUS | Status: DC | PRN
  Administered 2014-11-11: 50 mg via INTRAVENOUS

## 2014-11-11 SURGICAL SUPPLY — 22 items

## 2014-11-11 NOTE — Discharge Instructions (Signed)
Colonoscopy, Care After °These instructions give you information on caring for yourself after your procedure. Your doctor may also give you more specific instructions. Call your doctor if you have any problems or questions after your procedure. °HOME CARE °· Do not drive for 24 hours. °· Do not sign important papers or use machinery for 24 hours. °· You may shower. °· You may go back to your usual activities, but go slower for the first 24 hours. °· Take rest breaks often during the first 24 hours. °· Walk around or use warm packs on your belly (abdomen) if you have belly cramping or gas. °· Drink enough fluids to keep your pee (urine) clear or pale yellow. °· Resume your normal diet. Avoid heavy or fried foods. °· Avoid drinking alcohol for 24 hours or as told by your doctor. °· Only take medicines as told by your doctor. °If a tissue sample (biopsy) was taken during the procedure:  °· Do not take aspirin or blood thinners for 7 days, or as told by your doctor. °· Do not drink alcohol for 7 days, or as told by your doctor. °· Eat soft foods for the first 24 hours. °GET HELP IF: °You still have a small amount of blood in your poop (stool) 2-3 days after the procedure. °GET HELP RIGHT AWAY IF: °· You have more than a small amount of blood in your poop. °· You see clumps of tissue (blood clots) in your poop. °· Your belly is puffy (swollen). °· You feel sick to your stomach (nauseous) or throw up (vomit). °· You have a fever. °· You have belly pain that gets worse and medicine does not help. °MAKE SURE YOU: °· Understand these instructions. °· Will watch your condition. °· Will get help right away if you are not doing well or get worse. °Document Released: 09/24/2010 Document Revised: 08/27/2013 Document Reviewed: 04/29/2013 °Exit[employer]® Patient Information ©2015 [employer]. This information is not intended to replace advice given to you by your health care provider. Make sure you discuss any questions you have with  your health care provider. ° °

## 2014-11-11 NOTE — Op Note (Signed)
Procedure: Surveillance colonoscopy. Normal surveillance colonoscopy performed on 06/15/2009. Adenomatous colon polyp removed colonoscopically in 2005.  Endoscopist: Danise EdgeMartin Johnson  Premedication: Propofol administered by anesthesia  Procedure: The patient was placed in the left lateral decubitus position. Anal inspection and digital rectal exam were normal. The Pentax pediatric colonoscope was introduced into the rectum and advanced to the cecum. A normal-appearing appendiceal orifice was identified. A normal-appearing ileocecal valve was identified. Colonic preparation for the exam today was good. Withdrawal time was 15 minutes  Rectum. Normal. Retroflexed view of the distal rectum normal  Sigmoid colon and descending colon. Normal.  Splenic flexure. Normal  Transverse colon. Normal  Hepatic flexure. Normal  Ascending colon. Normal  Cecum and ileocecal valve. Normal  Assessment: Normal surveillance colonoscopy  Recommendation: Schedule repeat surveillance colonoscopy in approximately 5 years.

## 2014-11-11 NOTE — Anesthesia Postprocedure Evaluation (Signed)
  Anesthesia Post-op Note  Patient: Barbara Mcdonald  Procedure(s) Performed: Procedure(s) (LRB): COLONOSCOPY WITH PROPOFOL (N/A)  Patient Location: PACU  Anesthesia Type: MAC  Level of Consciousness: awake and alert   Airway and Oxygen Therapy: Patient Spontanous Breathing  Post-op Pain: mild  Post-op Assessment: Post-op Vital signs reviewed, Patient's Cardiovascular Status Stable, Respiratory Function Stable, Patent Airway and No signs of Nausea or vomiting  Last Vitals:  Filed Vitals:   11/11/14 1010  BP: 157/88  Pulse: 58  Temp: 36.7 C  Resp: 18    Post-op Vital Signs: stable   Complications: No apparent anesthesia complications

## 2014-11-11 NOTE — H&P (Signed)
Procedure: Surveillance colonoscopy. Normal surveillance colonoscopy performed on 06/15/2009. Adenomatous colon polyp removed colonoscopically in 2005.  History: The patient is a 69 year old female born 1946/04/20. She is scheduled to undergo a surveillance colonoscopy today.  Medication allergies: Penicillin causes abdominal discomfort  Past medical history: Type 2 diabetes mellitus. Sickle-thalassemia positive. Hypercholesterolemia. Asthma. Gastroesophageal reflux. Gastric bypass surgery performed in January 2009. Breast reduction surgery. D&C.  Exam: The patient is alert and lying comfortably on the endoscopy stretcher. Abdomen is soft and nontender to palpation. Lungs are clear to auscultation. Cardiac exam reveals a regular rhythm.  Plan: Proceed with surveillance colonoscopy

## 2014-11-11 NOTE — Anesthesia Preprocedure Evaluation (Signed)
Anesthesia Evaluation  Patient identified by MRN, date of birth, ID band Patient awake    Reviewed: Allergy & Precautions, NPO status , Patient's Chart, lab work & pertinent test results  Airway Mallampati: II  TM Distance: >3 FB Neck ROM: Full    Dental no notable dental hx. (+) Partial Upper   Pulmonary neg pulmonary ROS, former smoker,  breath sounds clear to auscultation  Pulmonary exam normal       Cardiovascular negative cardio ROS  Rhythm:Regular Rate:Normal     Neuro/Psych negative neurological ROS  negative psych ROS   GI/Hepatic negative GI ROS, Neg liver ROS,   Endo/Other  diabetes, Type 2, Oral Hypoglycemic Agents  Renal/GU negative Renal ROS  negative genitourinary   Musculoskeletal negative musculoskeletal ROS (+)   Abdominal   Peds negative pediatric ROS (+)  Hematology negative hematology ROS (+)   Anesthesia Other Findings   Reproductive/Obstetrics negative OB ROS                             Anesthesia Physical Anesthesia Plan  ASA: II  Anesthesia Plan: MAC   Post-op Pain Management:    Induction:   Airway Management Planned: Simple Face Mask  Additional Equipment:   Intra-op Plan:   Post-operative Plan:   Informed Consent: I have reviewed the patients History and Physical, chart, labs and discussed the procedure including the risks, benefits and alternatives for the proposed anesthesia with the patient or authorized representative who has indicated his/her understanding and acceptance.   Dental advisory given  Plan Discussed with: CRNA  Anesthesia Plan Comments:         Anesthesia Quick Evaluation

## 2014-11-11 NOTE — Transfer of Care (Signed)
Immediate Anesthesia Transfer of Care Note  Patient: Barbara Mcdonald  Procedure(s) Performed: Procedure(s): COLONOSCOPY WITH PROPOFOL (N/A)  Patient Location: PACU  Anesthesia Type:MAC  Level of Consciousness: awake, alert  and oriented  Airway & Oxygen Therapy: Patient Spontanous Breathing and Patient connected to face mask oxygen  Post-op Assessment: Report given to RN and Post -op Vital signs reviewed and stable  Post vital signs: Reviewed and stable  Last Vitals:  Filed Vitals:   11/11/14 0802  BP: 144/77  Pulse: 64  Temp: 36.7 C  Resp: 20    Complications: No apparent anesthesia complications

## 2014-11-12 ENCOUNTER — Encounter (HOSPITAL_COMMUNITY): Payer: Self-pay | Admitting: Gastroenterology

## 2014-11-18 ENCOUNTER — Other Ambulatory Visit (INDEPENDENT_AMBULATORY_CARE_PROVIDER_SITE_OTHER): Payer: Medicare Other

## 2014-11-18 DIAGNOSIS — E119 Type 2 diabetes mellitus without complications: Secondary | ICD-10-CM | POA: Diagnosis not present

## 2014-11-18 DIAGNOSIS — E669 Obesity, unspecified: Secondary | ICD-10-CM | POA: Diagnosis not present

## 2014-11-18 DIAGNOSIS — E1169 Type 2 diabetes mellitus with other specified complication: Secondary | ICD-10-CM

## 2014-11-18 LAB — COMPREHENSIVE METABOLIC PANEL
ALBUMIN: 4 g/dL (ref 3.5–5.2)
ALT: 19 U/L (ref 0–35)
AST: 21 U/L (ref 0–37)
Alkaline Phosphatase: 122 U/L — ABNORMAL HIGH (ref 39–117)
BUN: 14 mg/dL (ref 6–23)
CHLORIDE: 110 meq/L (ref 96–112)
CO2: 29 mEq/L (ref 19–32)
Calcium: 9.7 mg/dL (ref 8.4–10.5)
Creatinine, Ser: 1.12 mg/dL (ref 0.40–1.20)
GFR: 62.46 mL/min (ref >60.00)
GLUCOSE: 81 mg/dL (ref 70–99)
POTASSIUM: 4.3 meq/L (ref 3.5–5.1)
Sodium: 141 mEq/L (ref 135–145)
Total Bilirubin: 0.3 mg/dL (ref 0.2–1.2)
Total Protein: 7.2 g/dL (ref 6.0–8.3)

## 2014-11-18 LAB — MICROALBUMIN / CREATININE URINE RATIO
Creatinine,U: 84.1 mg/dL
Microalb Creat Ratio: 1.1 mg/g (ref 0.0–30.0)
Microalb, Ur: 0.9 mg/dL (ref 0.0–1.9)

## 2014-11-18 LAB — URINALYSIS, ROUTINE W REFLEX MICROSCOPIC
Bilirubin Urine: NEGATIVE
Hgb urine dipstick: NEGATIVE
KETONES UR: NEGATIVE
Nitrite: NEGATIVE
SPECIFIC GRAVITY, URINE: 1.02 (ref 1.000–1.030)
Total Protein, Urine: NEGATIVE
UROBILINOGEN UA: 0.2 (ref 0.0–1.0)
Urine Glucose: NEGATIVE
pH: 5.5 (ref 5.0–8.0)

## 2014-11-18 LAB — HEMOGLOBIN A1C: HEMOGLOBIN A1C: 6.1 % (ref 4.6–6.5)

## 2014-11-18 LAB — LIPID PANEL
CHOL/HDL RATIO: 3
CHOLESTEROL: 122 mg/dL (ref 0–200)
HDL: 41 mg/dL (ref >39.00)
LDL Cholesterol: 58 mg/dL (ref 0–99)
NONHDL: 81
Triglycerides: 113 mg/dL (ref 0.0–149.0)
VLDL: 22.6 mg/dL (ref 0.0–40.0)

## 2014-11-19 ENCOUNTER — Telehealth: Payer: Self-pay | Admitting: Endocrinology

## 2014-11-19 NOTE — Telephone Encounter (Signed)
Noted  

## 2014-11-19 NOTE — Telephone Encounter (Signed)
Doctor order is being resent pleasde indicate the testing times of the meds so they can ship the supplies

## 2014-11-21 ENCOUNTER — Ambulatory Visit (INDEPENDENT_AMBULATORY_CARE_PROVIDER_SITE_OTHER): Payer: Medicare Other | Admitting: Endocrinology

## 2014-11-21 ENCOUNTER — Encounter: Payer: Self-pay | Admitting: Endocrinology

## 2014-11-21 VITALS — BP 142/84 | HR 83 | Temp 98.4°F | Ht 64.0 in | Wt 239.0 lb

## 2014-11-21 DIAGNOSIS — E669 Obesity, unspecified: Secondary | ICD-10-CM

## 2014-11-21 DIAGNOSIS — E7800 Pure hypercholesterolemia, unspecified: Secondary | ICD-10-CM

## 2014-11-21 DIAGNOSIS — E119 Type 2 diabetes mellitus without complications: Secondary | ICD-10-CM | POA: Diagnosis not present

## 2014-11-21 DIAGNOSIS — E1169 Type 2 diabetes mellitus with other specified complication: Secondary | ICD-10-CM

## 2014-11-21 NOTE — Progress Notes (Signed)
Patient ID: Barbara Mcdonald, female   DOB: 07/15/1946, 69 y.o.   MRN: [id number]   Reason for Appointment: Diabetes follow-up   History of Present Illness   Diagnosis: Type 2 DIABETES MELITUS  She has had mild diabetes for several years which has been  well controlled after her gastric bypass surgery She has not been successful in maintaining her weight loss after the surgery and had regained significant amount She did do better with maintaining her weight with Victoza previously Her A1c tends to be relatively higher than expected for blood sugars  Recent history: She is being treated with metformin 1500 mg only for her mild diabetes Previously she was on Victoza until May 2015 when she stopped this on her own.   She thinks that the Victoza was not making any difference in her portion control for helping her with weight loss. She was also referred to the dietitian with some changes in her diet made subsequently.  However had been gaining weight progressively since stopping Victoza and she was given a trial of Invokana 100 mg daily in 12/18 She does not think it helped her weight loss or significantly change blood sugar and she stopped it on her own  A1c is still normal in the upper normal range although her blood sugars at home are slightly higher than normal including fasting She has checked her blood sugar at various times of the day and on average at least once a day recently Her weight has finally leveled off. However she has done her exercises quite irregularly She thinks she is generally watching portions except for some snacks     Meals: eating out  occasionally only Physical activity: exercise: was doing walking or water aerobics, now some walking         Glucose readings from one touch download:   PRE-MEAL Breakfast Lunch Dinner Bedtime Overall  Glucose range:  102-135       Median:      114    POST-MEAL PC Breakfast PC Lunch PC Dinner  Glucose range:  104-146    110-164   99-152   Mean/median:       Wt Readings from Last 3 Encounters:  11/21/14 239 lb (108.41 kg)  11/11/14 234 lb (106.142 kg)  08/22/14 239 lb 12.8 oz (108.773 kg)   Lab Results  Component Value Date   HGBA1C 6.1 11/18/2014   HGBA1C 6.2 08/19/2014   HGBA1C 5.8 02/17/2014   Lab Results  Component Value Date   MICROALBUR 0.9 11/18/2014   LDLCALC 58 11/18/2014   CREATININE 1.12 11/18/2014      Medication List       This list is accurate as of: 11/21/14 10:41 AM.  Always use your most recent med list.               aspirin 81 MG tablet  Take 81 mg by mouth daily.     CALTRATE 600+D PO  Take 1 tablet by mouth 2 (two) times daily.     canagliflozin 100 MG Tabs tablet  Commonly known as:  INVOKANA  Take 1 tablet (100 mg total) by mouth daily.     cetirizine 5 MG tablet  Commonly known as:  ZYRTEC  Take 5 mg by mouth every evening.     ferrous fumarate 325 (106 FE) MG Tabs tablet  Commonly known as:  HEMOCYTE - 106 mg FE  Take 1 tablet by mouth daily.     glucose blood test  strip  Commonly known as:  ONETOUCH VERIO  Use as instructed to check blood sugars 2 times per day dx code E11.9     KRILL OIL PO  Take 1 tablet by mouth daily.     metFORMIN 750 MG 24 hr tablet  Commonly known as:  GLUCOPHAGE-XR  Take 2 tablets (1,500 mg total) by mouth daily.     metFORMIN 750 MG 24 hr tablet  Commonly known as:  GLUCOPHAGE-XR  TAKE 2 TABLETS BY MOUTH EVERY DAY     MULTIVITAMIN & MINERAL PO  Take 1 tablet by mouth daily.     omeprazole 20 MG capsule  Commonly known as:  PRILOSEC  Take 20 mg by mouth every evening.     ONETOUCH DELICA LANCETS FINE Misc  Use to check blood sugar 2 times per day dx code E11.9     simvastatin 20 MG tablet  Commonly known as:  ZOCOR  Take 1 tablet (20 mg total) by mouth daily.     simvastatin 20 MG tablet  Commonly known as:  ZOCOR  TAKE 1 TABLET BY MOUTH DAILY     vitamin B-12 1000 MCG tablet  Commonly known as:   CYANOCOBALAMIN  Take 1,000 mcg by mouth daily.        Allergies:  Allergies  Allergen Reactions  . Aspirin     Past Medical History  Diagnosis Date  . Obesity   . Diabetes mellitus without complication   . Arthritis   . Anemia     Past Surgical History  Procedure Laterality Date  . Gastric bypass  4-5 years ago  . Breast surgery Bilateral years ago    reduction  . Dilation and curettage of uterus  yrs ago  . Colonoscopy with propofol N/A 11/11/2014    Procedure: COLONOSCOPY WITH PROPOFOL;  Surgeon: Charolett Bumpers, MD;  Location: [facility];  Service: Endoscopy;  Laterality: N/A;    Family History  Problem Relation Age of Onset  . Obesity Other   . Diabetes Other     Social History:  reports that she has quit smoking. She has never used smokeless tobacco. She reports that she does not drink alcohol or use illicit drugs.  Review of Systems:  Has not been treated for hypertension, blood pressure is sometimes higher than normal but has seen PCP with normal readings in 1/16  HYPERLIPIDEMIA: The lipid abnormality consists of elevated LDL, treated with simvastatin and well controlled.  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   ASSESSMENT/ PLAN:   Diabetes type 2   She is having fairly good control of her diabetes with normal A1c although blood sugars are slightly high fasting with average of about 115 She has difficulty with maintaining her weight  despite her history of gastric bypass surgery She is still reluctant to try medications for weight loss and she wants to  do it on on her own She has seen the dietitian previously and can do somewhat better  with her meal planning Although she has started exercise sporadically since her last visit she seems more motivated to do this now and will try to do this very regularly  High normal blood pressure: This is followed by PCP and has been recently consistently normal  HYPERCHOLESTEROLEMIA: Well controlled on simvastatin, this is partly being used for risk reduction   Barbara Mcdonald 11/21/2014, 10:41 AM   Addendum: Labs as follows    Lab on 11/18/2014  Component Date Value Ref Range Status  . Hgb A1c MFr Bld 11/18/2014 6.1  4.6 - 6.5 % Final   Glycemic Control Guidelines for People with Diabetes:Non Diabetic:  <6%Goal of Therapy: <7%Additional Action Suggested:  >8%   . Sodium 11/18/2014 141  135 - 145 mEq/L Final  . Potassium 11/18/2014 4.3  3.5 - 5.1 mEq/L Final  . Chloride 11/18/2014 110  96 - 112 mEq/L Final  . CO2 11/18/2014 29  19 - 32 mEq/L Final  . Glucose, Bld 11/18/2014 81  70 - 99 mg/dL Final  . BUN 16/06/9603 14  6 - 23 mg/dL Final  . Creatinine, Ser 11/18/2014 1.12  0.40 - 1.20 mg/dL Final  . Total Bilirubin 11/18/2014 0.3  0.2 - 1.2 mg/dL Final  . Alkaline Phosphatase 11/18/2014 122* 39 - 117 U/L Final  . AST 11/18/2014 21  0 - 37 U/L Final  . ALT 11/18/2014 19  0 - 35 U/L Final  . Total Protein 11/18/2014 7.2  6.0 - 8.3 g/dL Final  . Albumin 54/05/8118 4.0  3.5 - 5.2 g/dL Final  . Calcium 14/78/2956 9.7  8.4 - 10.5 mg/dL Final  . GFR 21/30/8657 62.46  >60.00 mL/min Final  . Cholesterol 11/18/2014 122  0 - 200 mg/dL Final   ATP III Classification       Desirable:  < 200 mg/dL               Borderline High:  200 - 239 mg/dL          High:  > = 846 mg/dL  . Triglycerides 11/18/2014 113.0  0.0 - 149.0 mg/dL Final   Normal:  <962 mg/dLBorderline High:  150 - 199 mg/dL  . HDL 11/18/2014 41.00  >39.00 mg/dL Final  . VLDL 95/28/4132 22.6  0.0 - 40.0 mg/dL Final  . LDL Cholesterol 11/18/2014 58  0  - 99 mg/dL Final  . Total CHOL/HDL Ratio 11/18/2014 3   Final                  Men          Women1/2 Average Risk     3.4          3.3Average Risk          5.0          4.42X Average Risk          9.6          7.13X Average Risk          15.0          11.0                      . NonHDL 11/18/2014 81.00   Final   NOTE:  Non-HDL goal should be 30 mg/dL higher than patient's LDL goal (i.e. LDL goal of < 70 mg/dL, would have non-HDL goal of < 100 mg/dL)  . Microalb, Ur 11/18/2014 0.9  0.0 - 1.9 mg/dL Final  . Creatinine,U 44/09/270 84.1  Final  . Microalb Creat Ratio 11/18/2014 1.1  0.0 - 30.0 mg/g Final  . Color, Urine 11/18/2014 YELLOW  Yellow;Lt. Yellow Final  . APPearance 11/18/2014 CLEAR  Clear Final  . Specific Gravity, Urine 11/18/2014 1.020  1.000-1.030 Final  . pH 11/18/2014 5.5  5.0 - 8.0 Final  . Total Protein, Urine 11/18/2014 NEGATIVE  Negative Final  . Urine Glucose 11/18/2014 NEGATIVE  Negative Final  . Ketones, ur 11/18/2014 NEGATIVE  Negative Final  . Bilirubin Urine 11/18/2014 NEGATIVE  Negative Final  . Hgb urine dipstick 11/18/2014 NEGATIVE  Negative Final  . Urobilinogen, UA 11/18/2014 0.2  0.0 - 1.0 Final  . Leukocytes, UA 11/18/2014 SMALL* Negative Final  . Nitrite 11/18/2014 NEGATIVE  Negative Final  . WBC, UA 11/18/2014 0-2/hpf  0-2/hpf Final  . RBC / HPF 11/18/2014 0-2/hpf  0-2/hpf Final  . Squamous Epithelial / LPF 11/18/2014 Rare(0-4/hpf)  Rare(0-4/hpf) Final  . Bacteria, UA 11/18/2014 Many(>50/hpf)* None Final

## 2014-11-21 NOTE — Patient Instructions (Signed)
Please check blood sugars half the time about 2 hours after any meal and 2-3 times per week on waking up.  Please bring blood sugar monitor to each visit. Recommended blood sugar levels about 2 hours after meal is 120-150 and on waking up 90-110

## 2014-11-24 ENCOUNTER — Telehealth: Payer: Self-pay | Admitting: Endocrinology

## 2014-11-24 NOTE — Telephone Encounter (Signed)
[facility] called in regards to the  Order of diabetes supplies they are missing the testing times. Call back [phone]

## 2014-12-29 ENCOUNTER — Other Ambulatory Visit: Payer: Self-pay | Admitting: Endocrinology

## 2015-01-25 ENCOUNTER — Other Ambulatory Visit: Payer: Self-pay | Admitting: Endocrinology

## 2015-01-26 ENCOUNTER — Other Ambulatory Visit: Payer: Self-pay | Admitting: *Deleted

## 2015-01-26 MED ORDER — METFORMIN HCL ER 750 MG PO TB24: 1500.0000 mg | ORAL_TABLET | Freq: Every day | ORAL | Status: DC

## 2015-04-28 ENCOUNTER — Other Ambulatory Visit: Payer: Self-pay | Admitting: Endocrinology

## 2015-05-26 ENCOUNTER — Encounter: Payer: Self-pay | Admitting: Endocrinology

## 2015-05-26 ENCOUNTER — Ambulatory Visit (INDEPENDENT_AMBULATORY_CARE_PROVIDER_SITE_OTHER): Payer: Medicare Other | Admitting: Endocrinology

## 2015-05-26 VITALS — BP 130/88 | HR 90 | Temp 98.8°F | Ht 64.5 in | Wt 251.0 lb

## 2015-05-26 DIAGNOSIS — E1169 Type 2 diabetes mellitus with other specified complication: Secondary | ICD-10-CM

## 2015-05-26 DIAGNOSIS — E669 Obesity, unspecified: Secondary | ICD-10-CM | POA: Diagnosis not present

## 2015-05-26 DIAGNOSIS — E7800 Pure hypercholesterolemia, unspecified: Secondary | ICD-10-CM

## 2015-05-26 DIAGNOSIS — E119 Type 2 diabetes mellitus without complications: Secondary | ICD-10-CM | POA: Diagnosis not present

## 2015-05-26 LAB — POCT GLYCOSYLATED HEMOGLOBIN (HGB A1C): Hemoglobin A1C: 6

## 2015-05-26 NOTE — Progress Notes (Signed)
Patient ID: Barbara Mcdonald, female   DOB: 11/05/1945, 69 y.o.   MRN: [id number]   Reason for Appointment:  follow-up   History of Present Illness   Diagnosis: Type 2 DIABETES MELITUS  She has had mild diabetes for several years which has been  well controlled after her gastric bypass surgery She has not been successful in maintaining her weight loss after the surgery and had regained significant amount She did do better with maintaining her weight with Victoza previously Her A1c tends to be relatively higher than expected for blood sugars  Recent history: She is being treated with metformin 1500 mg only for her mild diabetes Previously she was on Victoza until May 2015 when she stopped this on her own.   She thinks that the Victoza was not making any difference in her portion control for helping her with weight loss. She was also referred to the dietitian with some changes in her diet made subsequently.    However had been gaining weight progressively since stopping Victoza and she was given a trial of Invokana 100 mg daily in 12/15 She does not think it helped her weight loss or significantly change blood sugar and she stopped it on her own  A1c is still normal in the upper normal range She did not bring her blood sugar monitor for review; glucose levels at home are minimally increased  However she has not been exercising as discussed before.  Only in the last month or so she has started doing some water exercises She thinks she is generally watching portions except for some snacks     Meals: eating out  occasionally only Physical activity: exercise: was doing walking or water aerobics, now some walking         Glucose readings from recall, am 105 acl 130-140, after meals <160  OBESITY: She is continuing to gain weight and mostly in the last 6 months She has refused to use pharmacological therapy to control her weight gain and is not consistent with exercise, usually has  some excuses about not finding the time to do so and also not likely to be consistent with diet also   Wt Readings from Last 3 Encounters:  05/26/15 251 lb (113.853 kg)  11/21/14 239 lb (108.41 kg)  11/11/14 234 lb (106.142 kg)   Lab Results  Component Value Date   HGBA1C 6.1 11/18/2014   HGBA1C 6.2 08/19/2014   HGBA1C 5.8 02/17/2014   Lab Results  Component Value Date   MICROALBUR 0.9 11/18/2014   LDLCALC 58 11/18/2014   CREATININE 1.12 11/18/2014      Medication List       This list is accurate as of: 05/26/15  9:20 AM.  Always use your most recent med list.               aspirin 81 MG tablet  Take 81 mg by mouth daily.     CALTRATE 600+D PO  Take 1 tablet by mouth 2 (two) times daily.     cetirizine 5 MG tablet  Commonly known as:  ZYRTEC  Take 5 mg by mouth every evening.     ferrous fumarate 325 (106 FE) MG Tabs tablet  Commonly known as:  HEMOCYTE - 106 mg FE  Take 1 tablet by mouth daily.     glucose blood test strip  Commonly known as:  ONETOUCH VERIO  Use as instructed to check blood sugars 2 times per day dx code E11.9  KRILL OIL PO  Take 1 tablet by mouth daily.     metFORMIN 750 MG 24 hr tablet  Commonly known as:  GLUCOPHAGE-XR  Take 2 tablets (1,500 mg total) by mouth daily.     metFORMIN 750 MG 24 hr tablet  Commonly known as:  GLUCOPHAGE-XR  TAKE 2 TABLETS BY MOUTH ONCE DAILY     MULTIVITAMIN & MINERAL PO  Take 1 tablet by mouth daily.     omeprazole 20 MG capsule  Commonly known as:  PRILOSEC  Take 20 mg by mouth every evening.     ONETOUCH DELICA LANCETS FINE Misc  Use to check blood sugar 2 times per day dx code E11.9     simvastatin 20 MG tablet  Commonly known as:  ZOCOR  TAKE 1 TABLET BY MOUTH DAILY     simvastatin 20 MG tablet  Commonly known as:  ZOCOR  TAKE 1 TABLET BY MOUTH EVERY DAY     vitamin B-12 1000 MCG tablet  Commonly known as:  CYANOCOBALAMIN  Take 1,000 mcg by mouth daily.        Allergies:    Allergies  Allergen Reactions  . Aspirin     Past Medical History  Diagnosis Date  . Obesity   . Diabetes mellitus without complication   . Arthritis   . Anemia     Past Surgical History  Procedure Laterality Date  . Gastric bypass  4-5 years ago  . Breast surgery Bilateral years ago    reduction  . Dilation and curettage of uterus  yrs ago  . Colonoscopy with propofol N/A 11/11/2014    Procedure: COLONOSCOPY WITH PROPOFOL;  Surgeon: Charolett Bumpers, MD;  Location: [facility];  Service: Endoscopy;  Laterality: N/A;    Family History  Problem Relation Age of Onset  . Obesity Other   . Diabetes Mother   . Hypertension Brother   . Diabetes Brother     Social History:  reports that she has quit smoking. She has never used smokeless tobacco. She reports that she does not drink alcohol or use illicit drugs.  Review of Systems:  Has not been treated for hypertension, blood pressure is sometimes higher in the office and has not had a consistent primary care physician now  HYPERLIPIDEMIA: The lipid abnormality consists of elevated LDL, treated with simvastatin and well controlled.  Lab Results  Component Value Date   CHOL 122 11/18/2014   HDL 41.00 11/18/2014   LDLCALC 58 11/18/2014   TRIG 113.0 11/18/2014   CHOLHDL 3 11/18/2014    Last eye exam: 1/16  Has mild chronic anemia possibly from thalassemia trait, followed by PCP     Examination:   BP 130/88 mmHg  Pulse 90  Temp(Src) 98.8 F (37.1 C) (Oral)  Ht 5' 4.5" (1.638 m)  Wt 251 lb (113.853 kg)  BMI 42.43 kg/m2  SpO2 98%  Body mass index is 42.43 kg/(m^2).   ASSESSMENT/ PLAN:   Diabetes type 2   She is having fairly good control of her diabetes with normal A1c  of 6% No labs available and she did not bring her monitor for download  OBESITY: The patient was shown the graph of her weight over the last 2 years showing a progressive increase especially recently She has refused to consider interventions  to prevent further weight gain, she has gained back about 85 pounds that she had lost after her bariatric surgery  She is not able to be consistent with exercise and  not clear if she is watching her diet She refuses to see the dietitian again Also refused to consider Victoza or weight loss drugs However she agrees to come back in 2 months for reviewing of her weight and she thinks she can be more consistent exercise until then  High normal blood pressure: This needs to be followed closely and may consider pharmacological therapy on the next visit      KUMAR,AJAY 05/26/2015, 9:20 AM

## 2015-06-02 ENCOUNTER — Other Ambulatory Visit: Payer: Self-pay | Admitting: Internal Medicine

## 2015-06-02 DIAGNOSIS — R921 Mammographic calcification found on diagnostic imaging of breast: Secondary | ICD-10-CM

## 2015-07-02 ENCOUNTER — Other Ambulatory Visit: Payer: Self-pay | Admitting: Internal Medicine

## 2015-07-02 ENCOUNTER — Ambulatory Visit
Admission: RE | Admit: 2015-07-02 | Discharge: 2015-07-02 | Disposition: A | Discharge status: Home / Self Care | Payer: Medicare Other | Source: Ambulatory Visit | Attending: Internal Medicine | Admitting: Internal Medicine

## 2015-07-02 DIAGNOSIS — R921 Mammographic calcification found on diagnostic imaging of breast: Secondary | ICD-10-CM

## 2015-07-06 ENCOUNTER — Other Ambulatory Visit: Payer: Self-pay | Admitting: Endocrinology

## 2015-07-06 NOTE — Telephone Encounter (Signed)
Patient called and would like refills on her medications  Rx: Invokana  Lancets   [facility]    Thank you

## 2015-07-07 MED ORDER — INVOKANA 100 MG PO TABS: ORAL_TABLET | ORAL | Status: DC

## 2015-07-07 MED ORDER — ONETOUCH DELICA LANCETS FINE MISC: Status: AC

## 2015-07-07 NOTE — Telephone Encounter (Signed)
Refills sent to pts pharmacy.

## 2015-07-08 ENCOUNTER — Other Ambulatory Visit: Payer: Self-pay | Admitting: Endocrinology

## 2015-07-22 ENCOUNTER — Other Ambulatory Visit (INDEPENDENT_AMBULATORY_CARE_PROVIDER_SITE_OTHER): Payer: Medicare Other

## 2015-07-22 DIAGNOSIS — E1169 Type 2 diabetes mellitus with other specified complication: Secondary | ICD-10-CM

## 2015-07-22 DIAGNOSIS — E119 Type 2 diabetes mellitus without complications: Secondary | ICD-10-CM

## 2015-07-22 DIAGNOSIS — E669 Obesity, unspecified: Secondary | ICD-10-CM | POA: Diagnosis not present

## 2015-07-22 LAB — BASIC METABOLIC PANEL
BUN: 15 mg/dL (ref 6–23)
CALCIUM: 9.3 mg/dL (ref 8.4–10.5)
CO2: 27 meq/L (ref 19–32)
Chloride: 109 mEq/L (ref 96–112)
Creatinine, Ser: 1.07 mg/dL (ref 0.40–1.20)
GFR: 65.7 mL/min (ref >60.00)
Glucose, Bld: 103 mg/dL — ABNORMAL HIGH (ref 70–99)
POTASSIUM: 4.4 meq/L (ref 3.5–5.1)
SODIUM: 141 meq/L (ref 135–145)

## 2015-07-22 LAB — HEMOGLOBIN A1C: HEMOGLOBIN A1C: 6.2 % (ref 4.6–6.5)

## 2015-07-23 ENCOUNTER — Ambulatory Visit
Admission: RE | Admit: 2015-07-23 | Discharge: 2015-07-23 | Disposition: A | Discharge status: Home / Self Care | Payer: Medicare Other | Source: Ambulatory Visit | Attending: Internal Medicine | Admitting: Internal Medicine

## 2015-07-23 ENCOUNTER — Other Ambulatory Visit: Payer: Self-pay | Admitting: Internal Medicine

## 2015-07-23 DIAGNOSIS — R921 Mammographic calcification found on diagnostic imaging of breast: Secondary | ICD-10-CM

## 2015-07-27 ENCOUNTER — Encounter: Payer: Self-pay | Admitting: Endocrinology

## 2015-07-27 ENCOUNTER — Ambulatory Visit (INDEPENDENT_AMBULATORY_CARE_PROVIDER_SITE_OTHER): Payer: Medicare Other | Admitting: Endocrinology

## 2015-07-27 DIAGNOSIS — E669 Obesity, unspecified: Secondary | ICD-10-CM

## 2015-07-27 DIAGNOSIS — E119 Type 2 diabetes mellitus without complications: Secondary | ICD-10-CM | POA: Diagnosis not present

## 2015-07-27 DIAGNOSIS — E1169 Type 2 diabetes mellitus with other specified complication: Secondary | ICD-10-CM

## 2015-07-27 NOTE — Progress Notes (Signed)
Patient ID: Barbara Mcdonald, female   DOB: 02-Oct-1945, 69 y.o.   MRN: [id number]   Reason for Appointment:  follow-up   History of Present Illness   Diagnosis: Type 2 DIABETES MELITUS  She has had mild diabetes for several years which has been  well controlled after her gastric bypass surgery She has not been successful in maintaining her weight loss after the surgery and had regained significant amount She did do better with maintaining her weight with Victoza previously Her A1c tends to be relatively higher than expected for blood sugars  Recent history:  She  was being treated with metformin 1500 mg only for her mild diabetes Previously she was on Victoza until May 2015 when she stopped this on her own.   She thinks that the Victoza was not making any difference in her portion control for helping her with weight loss. She was also referred to the dietitian with some changes in her diet made subsequently.   Also she was given a trial of Invokana 100 mg daily in 12/15 She does not think it helped her weight loss or significantly change blood sugar and she stopped it on her own  Because of progressive weight gain she was advised to start an exercise program and be more consistent with diet.  She did not want to see the dietitian. On her own she started taking Invokana again about a month ago Her weight is down only 1 pound since her last visit She still has not started any exercise regimen except once or twice a week  A1c is still normal in the upper normal range     Meals: eating out occasionally only Physical activity: exercise: doing walking or water aerobics 1-2/7        Glucose readings from download sugar readings only on 3 days, ranging from 103-188 but high only on 11/2 both after breakfast and supper   OBESITY: She  has been continuing to gain weight prior to her last visit   She has refused to use pharmacological therapy to control her weight gain and is not  consistent with exercise She thinks she is trying to do a little better with diet but her weight has come down only 1 pound   Wt Readings from Last 3 Encounters:  07/27/15 250 lb (113.399 kg)  05/26/15 251 lb (113.853 kg)  11/21/14 239 lb (108.41 kg)   Lab Results  Component Value Date   HGBA1C 6.2 07/22/2015   HGBA1C 6.0 05/26/2015   HGBA1C 6.1 11/18/2014   Lab Results  Component Value Date   MICROALBUR 0.9 11/18/2014   LDLCALC 58 11/18/2014   CREATININE 1.07 07/22/2015      Medication List       This list is accurate as of: 07/27/15  9:28 AM.  Always use your most recent med list.               aspirin 81 MG tablet  Take 81 mg by mouth daily.     CALTRATE 600+D PO  Take 1 tablet by mouth 2 (two) times daily.     cetirizine 5 MG tablet  Commonly known as:  ZYRTEC  Take 5 mg by mouth every evening.     ferrous fumarate 325 (106 FE) MG Tabs tablet  Commonly known as:  HEMOCYTE - 106 mg FE  Take 1 tablet by mouth daily.     glucose blood test strip  Commonly known as:  ONETOUCH VERIO  Use  as instructed to check blood sugars 2 times per day dx code E11.9     INVOKANA 100 MG Tabs tablet  Generic drug:  canagliflozin  TAKE 1 TABLET BY MOUTH EVERY DAY     KRILL OIL PO  Take 1 tablet by mouth daily.     metFORMIN 750 MG 24 hr tablet  Commonly known as:  GLUCOPHAGE-XR  TAKE 2 TABLETS BY MOUTH ONCE DAILY     MULTIVITAMIN & MINERAL PO  Take 1 tablet by mouth daily.     omeprazole 20 MG capsule  Commonly known as:  PRILOSEC  Take 20 mg by mouth every evening.     ONETOUCH DELICA LANCETS FINE Misc  Use to check blood sugar 2 times per day dx code E11.9     simvastatin 20 MG tablet  Commonly known as:  ZOCOR  TAKE 1 TABLET BY MOUTH EVERY DAY     vitamin B-12 1000 MCG tablet  Commonly known as:  CYANOCOBALAMIN  Take 1,000 mcg by mouth daily.        Allergies:  Allergies  Allergen Reactions  . Aspirin     Past Medical History  Diagnosis Date    . Obesity   . Diabetes mellitus without complication (HCC)   . Arthritis   . Anemia     Past Surgical History  Procedure Laterality Date  . Gastric bypass  4-5 years ago  . Breast surgery Bilateral years ago    reduction  . Dilation and curettage of uterus  yrs ago  . Colonoscopy with propofol N/A 11/11/2014    Procedure: COLONOSCOPY WITH PROPOFOL;  Surgeon: Charolett BumpersMartin K Johnson, MD;  Location: [facility];  Service: Endoscopy;  Laterality: N/A;    Family History  Problem Relation Age of Onset  . Obesity Other   . Diabetes Mother   . Hypertension Brother   . Diabetes Brother     Social History:  reports that she has quit smoking. She has never used smokeless tobacco. She reports that she does not drink alcohol or use illicit drugs.  Review of Systems:  Has not been treated for hypertension, blood pressure is sometimes higher in the office  HYPERLIPIDEMIA: The lipid abnormality consists of elevated LDL, treated with simvastatin and well controlled.  Lab Results  Component Value Date   CHOL 122 11/18/2014   HDL 41.00 11/18/2014   LDLCALC 58 11/18/2014   TRIG 113.0 11/18/2014   CHOLHDL 3 11/18/2014    Last eye exam: 1/16  Has mild chronic anemia possibly from thalassemia trait, followed by PCP     Examination:   BP 126/78 mmHg  Pulse 84  Temp(Src) 97.9 F (36.6 C)  Resp 14  Ht 5' 4.5" (1.638 m)  Wt 250 lb (113.399 kg)  BMI 42.27 kg/m2  SpO2 96%  Body mass index is 42.27 kg/(m^2).   ASSESSMENT/ PLAN:   Diabetes type 2 With obesity    She is having fairly good control of her diabetes with normal A1c  of 6.2 %  Recently she has started taking Invokana which she had previously tried.   This may have leveled off her weight  She has not done many blood sugars at home and advisor to do more. She can do better with her diet with reducing sweets and high-fat foods like bacon She has also not been exercising enough and encouraged her to do this at least 3-4 days a  week    She can continue taking metformin and Invokana for now,  blood pressure is probably better controlled with taking Invokana   [name] 07/27/2015, 9:28 AM

## 2015-07-27 NOTE — Patient Instructions (Signed)
Reduce high fat foods and carbs  More exercise

## 2015-08-03 ENCOUNTER — Other Ambulatory Visit: Payer: Self-pay | Admitting: *Deleted

## 2015-08-03 MED ORDER — SIMVASTATIN 20 MG PO TABS: 20.0000 mg | ORAL_TABLET | Freq: Every day | ORAL | Status: DC

## 2015-08-25 ENCOUNTER — Other Ambulatory Visit: Payer: Self-pay | Admitting: Endocrinology

## 2015-11-19 ENCOUNTER — Other Ambulatory Visit (INDEPENDENT_AMBULATORY_CARE_PROVIDER_SITE_OTHER): Payer: Medicare Other

## 2015-11-19 DIAGNOSIS — E669 Obesity, unspecified: Secondary | ICD-10-CM | POA: Diagnosis not present

## 2015-11-19 DIAGNOSIS — E119 Type 2 diabetes mellitus without complications: Secondary | ICD-10-CM

## 2015-11-19 DIAGNOSIS — E1169 Type 2 diabetes mellitus with other specified complication: Secondary | ICD-10-CM

## 2015-11-19 LAB — COMPREHENSIVE METABOLIC PANEL
ALBUMIN: 3.9 g/dL (ref 3.5–5.2)
ALK PHOS: 106 U/L (ref 39–117)
ALT: 23 U/L (ref 0–35)
AST: 25 U/L (ref 0–37)
BUN: 16 mg/dL (ref 6–23)
CALCIUM: 9.6 mg/dL (ref 8.4–10.5)
CHLORIDE: 106 meq/L (ref 96–112)
CO2: 31 mEq/L (ref 19–32)
Creatinine, Ser: 1.05 mg/dL (ref 0.40–1.20)
GFR: 67.08 mL/min (ref >60.00)
Glucose, Bld: 115 mg/dL — ABNORMAL HIGH (ref 70–99)
POTASSIUM: 4.6 meq/L (ref 3.5–5.1)
Sodium: 142 mEq/L (ref 135–145)
TOTAL PROTEIN: 7.3 g/dL (ref 6.0–8.3)
Total Bilirubin: 0.4 mg/dL (ref 0.2–1.2)

## 2015-11-19 LAB — MICROALBUMIN / CREATININE URINE RATIO
CREATININE, U: 110.8 mg/dL
Microalb Creat Ratio: 3.1 mg/g (ref 0.0–30.0)
Microalb, Ur: 3.4 mg/dL — ABNORMAL HIGH (ref 0.0–1.9)

## 2015-11-19 LAB — HEMOGLOBIN A1C: Hgb A1c MFr Bld: 6.5 % (ref 4.6–6.5)

## 2015-11-24 ENCOUNTER — Ambulatory Visit: Payer: Medicare Other | Admitting: Endocrinology

## 2015-11-30 ENCOUNTER — Other Ambulatory Visit: Payer: Self-pay | Admitting: *Deleted

## 2015-11-30 ENCOUNTER — Telehealth: Payer: Self-pay | Admitting: Endocrinology

## 2015-11-30 MED ORDER — GLUCOSE BLOOD VI STRP: ORAL_STRIP | Status: AC

## 2015-11-30 NOTE — Telephone Encounter (Signed)
rx sent

## 2015-11-30 NOTE — Telephone Encounter (Signed)
Patient need test strips for the verio meter 100. Send to  [id number] - [place], [place] - [address] AT [facility] [address] & [address] [phone] (Phone) [phone])

## 2015-12-01 ENCOUNTER — Ambulatory Visit (INDEPENDENT_AMBULATORY_CARE_PROVIDER_SITE_OTHER): Payer: Medicare Other | Admitting: Endocrinology

## 2015-12-01 ENCOUNTER — Encounter: Payer: Self-pay | Admitting: Endocrinology

## 2015-12-01 VITALS — BP 138/88 | HR 79 | Temp 98.0°F | Resp 14 | Ht 64.5 in | Wt 247.8 lb

## 2015-12-01 DIAGNOSIS — IMO0001 Reserved for inherently not codable concepts without codable children: Secondary | ICD-10-CM

## 2015-12-01 DIAGNOSIS — E119 Type 2 diabetes mellitus without complications: Secondary | ICD-10-CM | POA: Diagnosis not present

## 2015-12-01 DIAGNOSIS — E669 Obesity, unspecified: Secondary | ICD-10-CM

## 2015-12-01 DIAGNOSIS — R030 Elevated blood-pressure reading, without diagnosis of hypertension: Secondary | ICD-10-CM | POA: Diagnosis not present

## 2015-12-01 DIAGNOSIS — E1169 Type 2 diabetes mellitus with other specified complication: Secondary | ICD-10-CM

## 2015-12-01 MED ORDER — CANAGLIFLOZIN 300 MG PO TABS: 300.0000 mg | ORAL_TABLET | Freq: Every day | ORAL | Status: DC

## 2015-12-01 NOTE — Progress Notes (Signed)
Patient ID: Barbara Mcdonald, female   DOB: December 23, 1945, 70 y.o.   MRN: [id number]   Reason for Appointment:  follow-up   History of Present Illness   Diagnosis: Type 2 DIABETES MELITUS  She has had mild diabetes for several years which has been  well controlled after her gastric bypass surgery She has not been successful in maintaining her weight loss after the surgery and had regained significant amount She did do better with maintaining her weight with Victoza previously Her A1c tends to be relatively higher than expected for blood sugars  Recent history:  She  was being treated with metformin 1500 mg only for her mild diabetes Previously she was on Victoza until May 2015 when she stopped this on her own.   She thinks that the Victoza was not making any difference in her portion control for helping her with weight loss. She was also referred to the dietitian with some changes in her diet made subsequently.   Also she was given a trial of Invokana 100 mg daily in 12/15 She does not think it helped her weight loss or significantly change blood sugar and she stopped it on her own  Because of progressive weight gain she was advised to start an exercise program and be more consistent with diet.   She did not want to see the dietitian.  Current management and blood sugar patterns:  With taking 100 mg of Invokana her weight is slightly better at 3 pounds less compared to her last visit  This is despite her not doing any active exercise, she claims she is too busy  She thinks she can do better on her diet but refuses to see the dietitian again  She has mildly increased blood sugars at all times and her A1c is near 0.5% higher than it was last September     Meals: eating out occasionally but thinks that she can do better with her meal planning and reduce higher calorie foods Physical activity: exercise: Not doing walking or water aerobics, she claims that she does not get time           Glucose readings from download  Mean values apply above for all meters except median for One Touch  PRE-MEAL Fasting Lunch Dinner Bedtime Overall  Glucose range: 123, 118       Mean/median:     133    POST-MEAL PC Breakfast PC Lunch PC Dinner  Glucose range:  146  133, 178   Mean/median:       OBESITY: She has lost 3 pounds Not following lifestyle changes with diet and exercise as above   Wt Readings from Last 3 Encounters:  12/01/15 247 lb 12.8 oz (112.401 kg)  07/27/15 250 lb (113.399 kg)  05/26/15 251 lb (113.853 kg)   Lab Results  Component Value Date   HGBA1C 6.5 11/19/2015   HGBA1C 6.2 07/22/2015   HGBA1C 6.0 05/26/2015   Lab Results  Component Value Date   MICROALBUR 3.4* 11/19/2015   LDLCALC 58 11/18/2014   CREATININE 1.05 11/19/2015   No visits with results within 1 Week(s) from this visit. Latest known visit with results is:  Lab on 11/19/2015  Component Date Value Ref Range Status  . Hgb A1c MFr Bld 11/19/2015 6.5  4.6 - 6.5 % Final   Glycemic Control Guidelines for People with Diabetes:Non Diabetic:  <6%Goal of Therapy: <7%Additional Action Suggested:  >8%   . Sodium 11/19/2015 142  135 - 145 mEq/L  Final  . Potassium 11/19/2015 4.6  3.5 - 5.1 mEq/L Final  . Chloride 11/19/2015 106  96 - 112 mEq/L Final  . CO2 11/19/2015 31  19 - 32 mEq/L Final  . Glucose, Bld 11/19/2015 115* 70 - 99 mg/dL Final  . BUN 40/98/119103/16/2017 16  6 - 23 mg/dL Final  . Creatinine, Ser 11/19/2015 1.05  0.40 - 1.20 mg/dL Final  . Total Bilirubin 11/19/2015 0.4  0.2 - 1.2 mg/dL Final  . Alkaline Phosphatase 11/19/2015 106  39 - 117 U/L Final  . AST 11/19/2015 25  0 - 37 U/L Final  . ALT 11/19/2015 23  0 - 35 U/L Final  . Total Protein 11/19/2015 7.3  6.0 - 8.3 g/dL Final  . Albumin 47/82/956203/16/2017 3.9  3.5 - 5.2 g/dL Final  . Calcium 13/08/657803/16/2017 9.6  8.4 - 10.5 mg/dL Final  . GFR 46/96/295203/16/2017 67.08  >60.00 mL/min Final  . Microalb, Ur 11/19/2015 3.4* 0.0 - 1.9 mg/dL Final  .  Creatinine,U 11/19/2015 110.8   Final  . Microalb Creat Ratio 11/19/2015 3.1  0.0 - 30.0 mg/g Final      Medication List       This list is accurate as of: 12/01/15  1:27 PM.  Always use your most recent med list.               aspirin 81 MG tablet  Take 81 mg by mouth daily.     CALTRATE 600+D PO  Take 1 tablet by mouth 2 (two) times daily.     canagliflozin 300 MG Tabs tablet  Commonly known as:  INVOKANA  Take 1 tablet (300 mg total) by mouth daily before breakfast.     cetirizine 5 MG tablet  Commonly known as:  ZYRTEC  Take 5 mg by mouth every evening.     ferrous fumarate 325 (106 Fe) MG Tabs tablet  Commonly known as:  HEMOCYTE - 106 mg FE  Take 1 tablet by mouth daily.     glucose blood test strip  Commonly known as:  ONETOUCH VERIO  Use as instructed to check blood sugars 2 times per day dx code E11.9     KRILL OIL PO  Take 1 tablet by mouth daily.     metFORMIN 750 MG 24 hr tablet  Commonly known as:  GLUCOPHAGE-XR  TAKE 2 TABLETS BY MOUTH ONCE DAILY     MULTIVITAMIN & MINERAL PO  Take 1 tablet by mouth daily.     omeprazole 20 MG capsule  Commonly known as:  PRILOSEC  Take 20 mg by mouth every evening.     ONETOUCH DELICA LANCETS FINE Misc  Use to check blood sugar 2 times per day dx code E11.9     simvastatin 20 MG tablet  Commonly known as:  ZOCOR  Take 1 tablet (20 mg total) by mouth daily.     vitamin B-12 1000 MCG tablet  Commonly known as:  CYANOCOBALAMIN  Take 1,000 mcg by mouth daily.        Allergies:  Allergies  Allergen Reactions  . Aspirin     Past Medical History  Diagnosis Date  . Obesity   . Diabetes mellitus without complication (HCC)   . Arthritis   . Anemia     Past Surgical History  Procedure Laterality Date  . Gastric bypass  4-5 years ago  . Breast surgery Bilateral years ago    reduction  . Dilation and curettage of uterus  yrs ago  .  Colonoscopy with propofol N/A 11/11/2014    Procedure: COLONOSCOPY  WITH PROPOFOL;  Surgeon: Charolett Bumpers, MD;  Location: [facility];  Service: Endoscopy;  Laterality: N/A;    Family History  Problem Relation Age of Onset  . Obesity Other   . Diabetes Mother   . Hypertension Brother   . Diabetes Brother     Social History:  reports that she has quit smoking. She has never used smokeless tobacco. She reports that she does not drink alcohol or use illicit drugs.  Review of Systems:  Has not been treated for hypertension, blood pressure is frequently higher in the office, has followed up with PCP  HYPERLIPIDEMIA: The lipid abnormality consists of elevated LDL, treated with simvastatin and well controlled.  Lab Results  Component Value Date   CHOL 122 11/18/2014   HDL 41.00 11/18/2014   LDLCALC 58 11/18/2014   TRIG 113.0 11/18/2014   CHOLHDL 3 11/18/2014    Last eye exam: 1/16  Has mild chronic anemia possibly from thalassemia trait, followed by PCP     Examination:   BP 138/88 mmHg  Pulse 79  Temp(Src) 98 F (36.7 C)  Resp 14  Ht 5' 4.5" (1.638 m)  Wt 247 lb 12.8 oz (112.401 kg)  BMI 41.89 kg/m2  SpO2 92%  Body mass index is 41.89 kg/(m^2).   ASSESSMENT/ PLAN:   Diabetes type 2 With obesity    Although her weight is stabilized with Invokana she has a relatively higher A1c of 6.5 She is not motivated to exercise or watch her diet consistently Discussed with her that if she is able to lose significant amount of weight she may not need oral hypoglycemic drugs  For now she will increase her Invokana to 300 mg since this may help her blood pressure also Continue metformin Start exercise with walking at least Watch diet better Follow-up in 3 months   She can continue taking metformin and Invokana for now, blood pressure is probably better controlled with taking Dossie Arbour 12/01/2015, 1:27 PM     Note: This office note was prepared with Dragon voice recognition system technology. Any transcriptional errors that  result from this process are unintentional.

## 2015-12-01 NOTE — Patient Instructions (Signed)
Check blood sugars on waking up 2  times a week Also check blood sugars about 2 hours after a meal and do this after different meals by rotation  Recommended blood sugar levels on waking up is 90-130 and about 2 hours after meal is 130-160  Please bring your blood sugar monitor to each visit, thank you  Walk daily

## 2015-12-23 ENCOUNTER — Other Ambulatory Visit: Payer: Self-pay | Admitting: Endocrinology

## 2015-12-30 LAB — LIPID PANEL: LDL CALC: 80 mg/dL

## 2016-01-06 ENCOUNTER — Other Ambulatory Visit: Payer: Self-pay | Admitting: *Deleted

## 2016-01-06 ENCOUNTER — Telehealth: Payer: Self-pay | Admitting: Endocrinology

## 2016-01-06 MED ORDER — METFORMIN HCL ER 750 MG PO TB24: 1500.0000 mg | ORAL_TABLET | Freq: Every day | ORAL | Status: DC

## 2016-01-06 NOTE — Telephone Encounter (Signed)
Rx sent for a 90 day supply with one refill.

## 2016-01-06 NOTE — Telephone Encounter (Signed)
Patient ask if her medication can be extended to more refills, and a 90 day supply, simvastatin (ZOCOR) 20 MG tablet, metFORMIN (GLUCOPHAGE-XR) 750 MG 24 hr tablet,  send to  [phone] - [place], [place] - [address] [facility] [phone] (Phone) [phone] (Fax)

## 2016-01-07 ENCOUNTER — Telehealth: Payer: Self-pay | Admitting: Endocrinology

## 2016-01-07 ENCOUNTER — Other Ambulatory Visit: Payer: Self-pay | Admitting: Endocrinology

## 2016-01-07 MED ORDER — INVOKANA 300 MG PO TABS: 300.0000 mg | ORAL_TABLET | Freq: Every day | ORAL | Status: DC

## 2016-01-07 MED ORDER — SIMVASTATIN 20 MG PO TABS
20.0000 mg | ORAL_TABLET | Freq: Every day | ORAL | Status: DC
Start: 2016-01-07 — End: 2016-09-14

## 2016-01-07 NOTE — Telephone Encounter (Signed)
Pt is anxious she is leaving to go out of the country on Tuesday and wants us to call in the meds asap please, let me know once complete and i will call pt to let her know we have completed this

## 2016-01-07 NOTE — Telephone Encounter (Signed)
I contacted the pt and advised we have refilled the rx for the simvastatin and invokana.

## 2016-01-07 NOTE — Telephone Encounter (Signed)
Simvastatin needs to be called in 90 day supply

## 2016-03-01 ENCOUNTER — Other Ambulatory Visit (INDEPENDENT_AMBULATORY_CARE_PROVIDER_SITE_OTHER): Payer: Medicare Other

## 2016-03-01 DIAGNOSIS — E119 Type 2 diabetes mellitus without complications: Secondary | ICD-10-CM

## 2016-03-01 DIAGNOSIS — E669 Obesity, unspecified: Secondary | ICD-10-CM | POA: Diagnosis not present

## 2016-03-01 DIAGNOSIS — E1169 Type 2 diabetes mellitus with other specified complication: Secondary | ICD-10-CM

## 2016-03-01 LAB — COMPREHENSIVE METABOLIC PANEL
ALT: 20 U/L (ref 0–35)
AST: 21 U/L (ref 0–37)
Albumin: 3.9 g/dL (ref 3.5–5.2)
Alkaline Phosphatase: 105 U/L (ref 39–117)
BILIRUBIN TOTAL: 0.3 mg/dL (ref 0.2–1.2)
BUN: 17 mg/dL (ref 6–23)
CALCIUM: 9.7 mg/dL (ref 8.4–10.5)
CO2: 28 mEq/L (ref 19–32)
Chloride: 110 mEq/L (ref 96–112)
Creatinine, Ser: 1.3 mg/dL — ABNORMAL HIGH (ref 0.40–1.20)
GFR: 52.38 mL/min — ABNORMAL LOW (ref >60.00)
Glucose, Bld: 104 mg/dL — ABNORMAL HIGH (ref 70–99)
POTASSIUM: 4.3 meq/L (ref 3.5–5.1)
Sodium: 141 mEq/L (ref 135–145)
TOTAL PROTEIN: 7.3 g/dL (ref 6.0–8.3)

## 2016-03-01 LAB — HEMOGLOBIN A1C: Hgb A1c MFr Bld: 5.9 % (ref 4.6–6.5)

## 2016-03-04 ENCOUNTER — Encounter: Payer: Self-pay | Admitting: Endocrinology

## 2016-03-04 ENCOUNTER — Ambulatory Visit (INDEPENDENT_AMBULATORY_CARE_PROVIDER_SITE_OTHER): Payer: Medicare Other | Admitting: Endocrinology

## 2016-03-04 VITALS — BP 132/80 | HR 74 | Ht 64.5 in | Wt 240.0 lb

## 2016-03-04 DIAGNOSIS — E669 Obesity, unspecified: Secondary | ICD-10-CM | POA: Diagnosis not present

## 2016-03-04 DIAGNOSIS — E119 Type 2 diabetes mellitus without complications: Secondary | ICD-10-CM

## 2016-03-04 DIAGNOSIS — E1169 Type 2 diabetes mellitus with other specified complication: Secondary | ICD-10-CM

## 2016-03-04 NOTE — Progress Notes (Signed)
Patient ID: Barbara Mcdonald, female   DOB: [birth year], 70 y.o.   MRN: [id number]   Reason for Appointment:  follow-up   History of Present Illness   Diagnosis: Type 2 DIABETES MELITUS  She has had mild diabetes for several years which has been  well controlled after her gastric bypass surgery She has not been successful in maintaining her weight loss after the surgery and had regained significant amount She did do better with maintaining her weight with Victoza previously Her A1c tends to be relatively higher than expected for blood sugars She  was being treated with metformin 1500 mg only for her mild diabetes Previously she was on Victoza until May 2015 when she stopped this on her own.   She thinks that the Victoza was not making any difference in her portion control for helping her with weight loss. She was also referred to the dietitian with some changes in her diet made subsequently.    Recent history:  Because of progressive weight gain she was advised to start an exercise program and be more consistent with diet.   She did not want to see the dietitian. She has been on Invokana again since 07/2015  Current management and blood sugar patterns:  Her weight has come down about 7 pounds and she thinks this is from her doing much more walking than usual, is also appearing more motivated  She continues to take Invokana and this was increased to 300 mg in March  At home she is checking her blood sugars mostly in the morning hours and sometimes midday with a range of 95-134, not clear if her monitor has the right time programmed  No side effects from Invokana and metformin     Meals: eating out occasionally but now trying to watch portions  Physical activity: exercise: Either walking, recently also somewhat exercises        Glucose readings from download as above, median glucose 113   Wt Readings from Last 3 Encounters:  03/04/16 240 lb (108.863 kg)  12/01/15 247  lb 12.8 oz (112.401 kg)  07/27/15 250 lb (113.399 kg)   Lab Results  Component Value Date   HGBA1C 5.9 03/01/2016   HGBA1C 6.5 11/19/2015   HGBA1C 6.2 07/22/2015   Lab Results  Component Value Date   MICROALBUR 3.4* 11/19/2015   LDLCALC 58 11/18/2014   CREATININE 1.30* 03/01/2016   Appointment on 03/01/2016  Component Date Value Ref Range Status  . Hgb A1c MFr Bld 03/01/2016 5.9  4.6 - 6.5 % Final   Glycemic Control Guidelines for People with Diabetes:Non Diabetic:  <6%Goal of Therapy: <7%Additional Action Suggested:  >8%   . Sodium 03/01/2016 141  135 - 145 mEq/L Final  . Potassium 03/01/2016 4.3  3.5 - 5.1 mEq/L Final  . Chloride 03/01/2016 110  96 - 112 mEq/L Final  . CO2 03/01/2016 28  19 - 32 mEq/L Final  . Glucose, Bld 03/01/2016 104* 70 - 99 mg/dL Final  . BUN 78/29/5621 17  6 - 23 mg/dL Final  . Creatinine, Ser 03/01/2016 1.30* 0.40 - 1.20 mg/dL Final  . Total Bilirubin 03/01/2016 0.3  0.2 - 1.2 mg/dL Final  . Alkaline Phosphatase 03/01/2016 105  39 - 117 U/L Final  . AST 03/01/2016 21  0 - 37 U/L Final  . ALT 03/01/2016 20  0 - 35 U/L Final  . Total Protein 03/01/2016 7.3  6.0 - 8.3 g/dL Final  . Albumin 30/86/5784 3.9  3.5 -  5.2 g/dL Final  . Calcium 29/56/213006/27/2017 9.7  8.4 - 10.5 mg/dL Final  . GFR 86/57/846906/27/2017 52.38* >60.00 mL/min Final      Medication List       This list is accurate as of: 03/04/16  5:07 PM.  Always use your most recent med list.               aspirin 81 MG tablet  Take 81 mg by mouth daily.     CALTRATE 600+D PO  Take 1 tablet by mouth 2 (two) times daily.     cetirizine 5 MG tablet  Commonly known as:  ZYRTEC  Take 5 mg by mouth every evening.     ferrous fumarate 325 (106 Fe) MG Tabs tablet  Commonly known as:  HEMOCYTE - 106 mg FE  Take 1 tablet by mouth daily.     glucosamine-chondroitin 500-400 MG tablet  Take 1 tablet by mouth 2 (two) times daily.     glucose blood test strip  Commonly known as:  ONETOUCH VERIO  Use as  instructed to check blood sugars 2 times per day dx code E11.9     INVOKANA 300 MG Tabs tablet  Generic drug:  canagliflozin  Take 1 tablet (300 mg total) by mouth daily before breakfast.     KRILL OIL PO  Take 1 tablet by mouth daily.     metFORMIN 750 MG 24 hr tablet  Commonly known as:  GLUCOPHAGE-XR  Take 2 tablets (1,500 mg total) by mouth daily.     MULTIVITAMIN & MINERAL PO  Take 1 tablet by mouth daily.     omeprazole 20 MG capsule  Commonly known as:  PRILOSEC  Take 20 mg by mouth every evening.     ONETOUCH DELICA LANCETS FINE Misc  Use to check blood sugar 2 times per day dx code E11.9     simvastatin 20 MG tablet  Commonly known as:  ZOCOR  Take 1 tablet (20 mg total) by mouth daily.     vitamin B-12 1000 MCG tablet  Commonly known as:  CYANOCOBALAMIN  Take 1,000 mcg by mouth daily.        Allergies:  Allergies  Allergen Reactions  . Aspirin     Past Medical History  Diagnosis Date  . Obesity   . Diabetes mellitus without complication (HCC)   . Arthritis   . Anemia     Past Surgical History  Procedure Laterality Date  . Gastric bypass  4-5 years ago  . Breast surgery Bilateral years ago    reduction  . Dilation and curettage of uterus  yrs ago  . Colonoscopy with propofol N/A 11/11/2014    Procedure: COLONOSCOPY WITH PROPOFOL;  Surgeon: Charolett BumpersMartin K Johnson, MD;  Location: [facility];  Service: Endoscopy;  Laterality: N/A;    Family History  Problem Relation Age of Onset  . Obesity Other   . Diabetes Mother   . Hypertension Brother   . Diabetes Brother     Social History:  reports that she has quit smoking. She has never used smokeless tobacco. She reports that she does not drink alcohol or use illicit drugs.  Review of Systems:  Has not been treated for hypertension, blood pressure previously would be higher in the office  HYPERLIPIDEMIA: The lipid abnormality consists of elevated LDL, treated with simvastatin and well  controlled.  Lab Results  Component Value Date   CHOL 122 11/18/2014   HDL 41.00 11/18/2014   LDLCALC 58 11/18/2014  TRIG 113.0 11/18/2014   CHOLHDL 3 11/18/2014    Last eye exam: 1/16  Has mild chronic anemia possibly from thalassemia trait, followed by PCP She has been on iron and asking about interviewing this.  No recent studies available     Examination:   BP 132/80 mmHg  Pulse 74  Ht 5' 4.5" (1.638 m)  Wt 240 lb (108.863 kg)  BMI 40.57 kg/m2  SpO2 94%  Body mass index is 40.57 kg/(m^2).   ASSESSMENT/ PLAN:   Diabetes type 2 With obesity    She has now done well with food blood sugar control as well as weight with increasing Invokana as well as starting to be much better compliant with exercise regimen especially when she was traveling She is somewhat more motivated and trying to do better on diet Blood sugars are looking close to normal at home  Discussed that we may be able to taper off her Invokana again.  She can try taking 150 mg for now until next visit  Anemia: She does need to follow-up with PCP.  We do not have any iron or hemoglobin studies. 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 Any transcriptional errors that result from this process are unintentional.

## 2016-03-04 NOTE — Patient Instructions (Addendum)
Reduce Invokana to 1/2  Stop Iron  Check blood sugars on waking up 2-3  times a week Also check blood sugars about 2 hours after a meal and do this after different meals by rotation  Recommended blood sugar levels on waking up is 90-130 and about 2 hours after meal is 130-160  Please bring your blood sugar monitor to each visit, thank you  Keep exercising daily

## 2016-05-23 ENCOUNTER — Other Ambulatory Visit: Payer: Self-pay | Admitting: Internal Medicine

## 2016-05-23 DIAGNOSIS — Z1231 Encounter for screening mammogram for malignant neoplasm of breast: Secondary | ICD-10-CM

## 2016-05-31 ENCOUNTER — Other Ambulatory Visit: Payer: Medicare Other

## 2016-06-03 ENCOUNTER — Ambulatory Visit: Payer: Medicare Other | Admitting: Endocrinology

## 2016-06-06 ENCOUNTER — Ambulatory Visit: Payer: Medicare Other | Admitting: Endocrinology

## 2016-07-04 ENCOUNTER — Ambulatory Visit
Admission: RE | Admit: 2016-07-04 | Discharge: 2016-07-04 | Disposition: A | Discharge status: Home / Self Care | Payer: Medicare Other | Source: Ambulatory Visit | Attending: Internal Medicine | Admitting: Internal Medicine

## 2016-07-04 DIAGNOSIS — Z1231 Encounter for screening mammogram for malignant neoplasm of breast: Secondary | ICD-10-CM

## 2016-09-14 ENCOUNTER — Other Ambulatory Visit: Payer: Self-pay | Admitting: Endocrinology

## 2017-05-30 ENCOUNTER — Other Ambulatory Visit: Payer: Self-pay | Admitting: Internal Medicine

## 2017-05-30 DIAGNOSIS — Z1239 Encounter for other screening for malignant neoplasm of breast: Secondary | ICD-10-CM

## 2017-07-11 ENCOUNTER — Ambulatory Visit
Admission: RE | Admit: 2017-07-11 | Discharge: 2017-07-11 | Disposition: A | Discharge status: Home / Self Care | Payer: Medicare Other | Source: Ambulatory Visit | Attending: Internal Medicine | Admitting: Internal Medicine

## 2017-07-11 ENCOUNTER — Ambulatory Visit: Payer: Medicare Other

## 2017-07-11 DIAGNOSIS — Z1239 Encounter for other screening for malignant neoplasm of breast: Secondary | ICD-10-CM

## 2018-06-05 ENCOUNTER — Other Ambulatory Visit: Payer: Self-pay | Admitting: Internal Medicine

## 2018-06-05 DIAGNOSIS — Z1231 Encounter for screening mammogram for malignant neoplasm of breast: Secondary | ICD-10-CM

## 2018-07-17 ENCOUNTER — Other Ambulatory Visit: Payer: Self-pay | Admitting: Internal Medicine

## 2018-07-17 ENCOUNTER — Ambulatory Visit
Admission: RE | Admit: 2018-07-17 | Discharge: 2018-07-17 | Disposition: A | Discharge status: Home / Self Care | Payer: Medicare Other | Source: Ambulatory Visit | Attending: Internal Medicine | Admitting: Internal Medicine

## 2018-07-17 DIAGNOSIS — Z1231 Encounter for screening mammogram for malignant neoplasm of breast: Secondary | ICD-10-CM

## 2018-07-17 DIAGNOSIS — N63 Unspecified lump in unspecified breast: Secondary | ICD-10-CM

## 2018-07-25 ENCOUNTER — Ambulatory Visit
Admission: RE | Admit: 2018-07-25 | Discharge: 2018-07-25 | Disposition: A | Discharge status: Home / Self Care | Payer: Medicare Other | Source: Ambulatory Visit | Attending: Internal Medicine | Admitting: Internal Medicine

## 2018-07-25 DIAGNOSIS — N63 Unspecified lump in unspecified breast: Secondary | ICD-10-CM

## 2019-06-07 ENCOUNTER — Other Ambulatory Visit: Payer: Self-pay

## 2019-06-07 DIAGNOSIS — Z20822 Contact with and (suspected) exposure to covid-19: Secondary | ICD-10-CM

## 2019-06-09 LAB — NOVEL CORONAVIRUS, NAA: SARS-CoV-2, NAA: NOT DETECTED

## 2019-06-25 ENCOUNTER — Other Ambulatory Visit: Payer: Self-pay | Admitting: Internal Medicine

## 2019-06-25 DIAGNOSIS — Z1231 Encounter for screening mammogram for malignant neoplasm of breast: Secondary | ICD-10-CM

## 2019-08-14 ENCOUNTER — Other Ambulatory Visit: Payer: Self-pay

## 2019-08-14 ENCOUNTER — Ambulatory Visit
Admission: RE | Admit: 2019-08-14 | Discharge: 2019-08-14 | Disposition: A | Discharge status: Home / Self Care | Payer: Medicare Other | Source: Ambulatory Visit | Attending: Internal Medicine | Admitting: Internal Medicine

## 2019-08-14 DIAGNOSIS — Z1231 Encounter for screening mammogram for malignant neoplasm of breast: Secondary | ICD-10-CM

## 2020-05-19 ENCOUNTER — Ambulatory Visit: Payer: Self-pay | Attending: Internal Medicine

## 2020-05-19 DIAGNOSIS — Z23 Encounter for immunization: Secondary | ICD-10-CM

## 2020-05-19 NOTE — Progress Notes (Signed)
   Covid-[facility] Vaccination [facility]  Name:  ANNASOFIA POHL    MRN: [id number] DOB: [birth year]  05/19/2020  Ms. Rubert was observed post Covid-19 immunization for 15 minutes without incident. She was provided with Vaccine Information Sheet and instruction to access the V-Safe system.   Ms. Sauseda was instructed to call [phone] with any severe reactions post vaccine: Marland Kitchen Difficulty breathing  . Swelling of face and throat  . A fast heartbeat  . A bad rash all over body  . Dizziness and weakness

## 2020-07-14 ENCOUNTER — Other Ambulatory Visit: Payer: Self-pay | Admitting: Internal Medicine

## 2020-07-14 DIAGNOSIS — Z1231 Encounter for screening mammogram for malignant neoplasm of breast: Secondary | ICD-10-CM

## 2020-08-25 ENCOUNTER — Ambulatory Visit
Admission: RE | Admit: 2020-08-25 | Discharge: 2020-08-25 | Disposition: A | Discharge status: Home / Self Care | Payer: Medicare Other | Source: Ambulatory Visit | Attending: Internal Medicine | Admitting: Internal Medicine

## 2020-08-25 ENCOUNTER — Other Ambulatory Visit: Payer: Self-pay

## 2020-08-25 DIAGNOSIS — Z1231 Encounter for screening mammogram for malignant neoplasm of breast: Secondary | ICD-10-CM

## 2020-09-10 DIAGNOSIS — Z7984 Long term (current) use of oral hypoglycemic drugs: Secondary | ICD-10-CM | POA: Diagnosis not present

## 2020-09-10 DIAGNOSIS — I10 Essential (primary) hypertension: Secondary | ICD-10-CM | POA: Diagnosis not present

## 2020-09-10 DIAGNOSIS — E7800 Pure hypercholesterolemia, unspecified: Secondary | ICD-10-CM | POA: Diagnosis not present

## 2020-09-10 DIAGNOSIS — Z0000 Encounter for general adult medical examination without abnormal findings: Secondary | ICD-10-CM | POA: Diagnosis not present

## 2020-09-10 DIAGNOSIS — E1169 Type 2 diabetes mellitus with other specified complication: Secondary | ICD-10-CM | POA: Diagnosis not present

## 2021-02-25 ENCOUNTER — Ambulatory Visit: Payer: Medicare Other | Attending: Internal Medicine

## 2021-02-25 DIAGNOSIS — Z20822 Contact with and (suspected) exposure to covid-19: Secondary | ICD-10-CM

## 2021-02-26 DIAGNOSIS — D649 Anemia, unspecified: Secondary | ICD-10-CM | POA: Diagnosis not present

## 2021-02-26 DIAGNOSIS — J45909 Unspecified asthma, uncomplicated: Secondary | ICD-10-CM | POA: Diagnosis not present

## 2021-02-26 DIAGNOSIS — E1169 Type 2 diabetes mellitus with other specified complication: Secondary | ICD-10-CM | POA: Diagnosis not present

## 2021-02-26 DIAGNOSIS — Z20822 Contact with and (suspected) exposure to covid-19: Secondary | ICD-10-CM | POA: Diagnosis not present

## 2021-02-26 DIAGNOSIS — J069 Acute upper respiratory infection, unspecified: Secondary | ICD-10-CM | POA: Diagnosis not present

## 2021-02-26 DIAGNOSIS — E7800 Pure hypercholesterolemia, unspecified: Secondary | ICD-10-CM | POA: Diagnosis not present

## 2021-02-26 DIAGNOSIS — I10 Essential (primary) hypertension: Secondary | ICD-10-CM | POA: Diagnosis not present

## 2021-02-26 DIAGNOSIS — K219 Gastro-esophageal reflux disease without esophagitis: Secondary | ICD-10-CM | POA: Diagnosis not present

## 2021-02-26 LAB — SARS-COV-2, NAA 2 DAY TAT

## 2021-02-26 LAB — NOVEL CORONAVIRUS, NAA: SARS-CoV-2, NAA: DETECTED — AB

## 2021-03-16 DIAGNOSIS — E7800 Pure hypercholesterolemia, unspecified: Secondary | ICD-10-CM | POA: Diagnosis not present

## 2021-03-16 DIAGNOSIS — I10 Essential (primary) hypertension: Secondary | ICD-10-CM | POA: Diagnosis not present

## 2021-03-16 DIAGNOSIS — Z7984 Long term (current) use of oral hypoglycemic drugs: Secondary | ICD-10-CM | POA: Diagnosis not present

## 2021-03-16 DIAGNOSIS — E1169 Type 2 diabetes mellitus with other specified complication: Secondary | ICD-10-CM | POA: Diagnosis not present

## 2021-03-23 DIAGNOSIS — I10 Essential (primary) hypertension: Secondary | ICD-10-CM | POA: Diagnosis not present

## 2021-03-23 DIAGNOSIS — K219 Gastro-esophageal reflux disease without esophagitis: Secondary | ICD-10-CM | POA: Diagnosis not present

## 2021-03-23 DIAGNOSIS — E119 Type 2 diabetes mellitus without complications: Secondary | ICD-10-CM | POA: Diagnosis not present

## 2021-03-23 DIAGNOSIS — J45909 Unspecified asthma, uncomplicated: Secondary | ICD-10-CM | POA: Diagnosis not present

## 2021-03-23 DIAGNOSIS — E1169 Type 2 diabetes mellitus with other specified complication: Secondary | ICD-10-CM | POA: Diagnosis not present

## 2021-03-23 DIAGNOSIS — E7800 Pure hypercholesterolemia, unspecified: Secondary | ICD-10-CM | POA: Diagnosis not present

## 2021-04-20 DIAGNOSIS — E7800 Pure hypercholesterolemia, unspecified: Secondary | ICD-10-CM | POA: Diagnosis not present

## 2021-04-20 DIAGNOSIS — J45909 Unspecified asthma, uncomplicated: Secondary | ICD-10-CM | POA: Diagnosis not present

## 2021-04-20 DIAGNOSIS — K219 Gastro-esophageal reflux disease without esophagitis: Secondary | ICD-10-CM | POA: Diagnosis not present

## 2021-04-20 DIAGNOSIS — D649 Anemia, unspecified: Secondary | ICD-10-CM | POA: Diagnosis not present

## 2021-04-20 DIAGNOSIS — E1169 Type 2 diabetes mellitus with other specified complication: Secondary | ICD-10-CM | POA: Diagnosis not present

## 2021-04-20 DIAGNOSIS — I10 Essential (primary) hypertension: Secondary | ICD-10-CM | POA: Diagnosis not present

## 2021-04-20 DIAGNOSIS — E119 Type 2 diabetes mellitus without complications: Secondary | ICD-10-CM | POA: Diagnosis not present

## 2021-05-21 DIAGNOSIS — E1169 Type 2 diabetes mellitus with other specified complication: Secondary | ICD-10-CM | POA: Diagnosis not present

## 2021-05-21 DIAGNOSIS — E119 Type 2 diabetes mellitus without complications: Secondary | ICD-10-CM | POA: Diagnosis not present

## 2021-05-21 DIAGNOSIS — D649 Anemia, unspecified: Secondary | ICD-10-CM | POA: Diagnosis not present

## 2021-05-21 DIAGNOSIS — I10 Essential (primary) hypertension: Secondary | ICD-10-CM | POA: Diagnosis not present

## 2021-05-21 DIAGNOSIS — K219 Gastro-esophageal reflux disease without esophagitis: Secondary | ICD-10-CM | POA: Diagnosis not present

## 2021-05-21 DIAGNOSIS — E7800 Pure hypercholesterolemia, unspecified: Secondary | ICD-10-CM | POA: Diagnosis not present

## 2021-05-21 DIAGNOSIS — J45909 Unspecified asthma, uncomplicated: Secondary | ICD-10-CM | POA: Diagnosis not present

## 2021-06-16 DIAGNOSIS — E1165 Type 2 diabetes mellitus with hyperglycemia: Secondary | ICD-10-CM | POA: Diagnosis not present

## 2021-06-16 DIAGNOSIS — H35413 Lattice degeneration of retina, bilateral: Secondary | ICD-10-CM | POA: Diagnosis not present

## 2021-06-16 DIAGNOSIS — H2513 Age-related nuclear cataract, bilateral: Secondary | ICD-10-CM | POA: Diagnosis not present

## 2021-06-16 DIAGNOSIS — H04123 Dry eye syndrome of bilateral lacrimal glands: Secondary | ICD-10-CM | POA: Diagnosis not present

## 2021-07-27 ENCOUNTER — Other Ambulatory Visit: Payer: Self-pay | Admitting: Internal Medicine

## 2021-07-27 DIAGNOSIS — Z1231 Encounter for screening mammogram for malignant neoplasm of breast: Secondary | ICD-10-CM

## 2021-09-01 ENCOUNTER — Ambulatory Visit
Admission: RE | Admit: 2021-09-01 | Discharge: 2021-09-01 | Disposition: A | Discharge status: Home / Self Care | Payer: Medicare Other | Source: Ambulatory Visit | Attending: Internal Medicine | Admitting: Internal Medicine

## 2021-09-01 DIAGNOSIS — Z1231 Encounter for screening mammogram for malignant neoplasm of breast: Secondary | ICD-10-CM | POA: Diagnosis not present

## 2021-09-02 DIAGNOSIS — E1169 Type 2 diabetes mellitus with other specified complication: Secondary | ICD-10-CM | POA: Diagnosis not present

## 2021-09-02 DIAGNOSIS — I10 Essential (primary) hypertension: Secondary | ICD-10-CM | POA: Diagnosis not present

## 2021-09-02 DIAGNOSIS — E119 Type 2 diabetes mellitus without complications: Secondary | ICD-10-CM | POA: Diagnosis not present

## 2021-09-02 DIAGNOSIS — J45909 Unspecified asthma, uncomplicated: Secondary | ICD-10-CM | POA: Diagnosis not present

## 2021-09-02 DIAGNOSIS — K219 Gastro-esophageal reflux disease without esophagitis: Secondary | ICD-10-CM | POA: Diagnosis not present

## 2021-09-02 DIAGNOSIS — H35033 Hypertensive retinopathy, bilateral: Secondary | ICD-10-CM | POA: Diagnosis not present

## 2021-09-02 DIAGNOSIS — E7800 Pure hypercholesterolemia, unspecified: Secondary | ICD-10-CM | POA: Diagnosis not present

## 2021-09-22 DIAGNOSIS — E1169 Type 2 diabetes mellitus with other specified complication: Secondary | ICD-10-CM | POA: Diagnosis not present

## 2021-09-22 DIAGNOSIS — Z0000 Encounter for general adult medical examination without abnormal findings: Secondary | ICD-10-CM | POA: Diagnosis not present

## 2021-09-22 DIAGNOSIS — E7800 Pure hypercholesterolemia, unspecified: Secondary | ICD-10-CM | POA: Diagnosis not present

## 2021-09-22 DIAGNOSIS — I10 Essential (primary) hypertension: Secondary | ICD-10-CM | POA: Diagnosis not present

## 2021-09-22 DIAGNOSIS — Z7984 Long term (current) use of oral hypoglycemic drugs: Secondary | ICD-10-CM | POA: Diagnosis not present

## 2021-12-03 DIAGNOSIS — I10 Essential (primary) hypertension: Secondary | ICD-10-CM | POA: Diagnosis not present

## 2021-12-03 DIAGNOSIS — E7800 Pure hypercholesterolemia, unspecified: Secondary | ICD-10-CM | POA: Diagnosis not present

## 2021-12-03 DIAGNOSIS — E1169 Type 2 diabetes mellitus with other specified complication: Secondary | ICD-10-CM | POA: Diagnosis not present

## 2021-12-13 DIAGNOSIS — K219 Gastro-esophageal reflux disease without esophagitis: Secondary | ICD-10-CM | POA: Diagnosis not present

## 2021-12-13 DIAGNOSIS — E7800 Pure hypercholesterolemia, unspecified: Secondary | ICD-10-CM | POA: Diagnosis not present

## 2021-12-13 DIAGNOSIS — E1169 Type 2 diabetes mellitus with other specified complication: Secondary | ICD-10-CM | POA: Diagnosis not present

## 2021-12-13 DIAGNOSIS — I10 Essential (primary) hypertension: Secondary | ICD-10-CM | POA: Diagnosis not present

## 2022-03-24 DIAGNOSIS — R079 Chest pain, unspecified: Secondary | ICD-10-CM | POA: Diagnosis not present

## 2022-03-24 DIAGNOSIS — E7800 Pure hypercholesterolemia, unspecified: Secondary | ICD-10-CM | POA: Diagnosis not present

## 2022-03-24 DIAGNOSIS — I10 Essential (primary) hypertension: Secondary | ICD-10-CM | POA: Diagnosis not present

## 2022-03-24 DIAGNOSIS — E1169 Type 2 diabetes mellitus with other specified complication: Secondary | ICD-10-CM | POA: Diagnosis not present

## 2022-03-25 ENCOUNTER — Ambulatory Visit
Admission: RE | Admit: 2022-03-25 | Discharge: 2022-03-25 | Disposition: A | Discharge status: Home / Self Care | Payer: Medicare Other | Source: Ambulatory Visit | Attending: Internal Medicine | Admitting: Internal Medicine

## 2022-03-25 ENCOUNTER — Other Ambulatory Visit: Payer: Self-pay | Admitting: Internal Medicine

## 2022-03-25 DIAGNOSIS — R0789 Other chest pain: Secondary | ICD-10-CM | POA: Diagnosis not present

## 2022-03-25 DIAGNOSIS — I259 Chronic ischemic heart disease, unspecified: Secondary | ICD-10-CM

## 2022-03-25 DIAGNOSIS — I700 Atherosclerosis of aorta: Secondary | ICD-10-CM | POA: Diagnosis not present

## 2022-03-25 DIAGNOSIS — M47814 Spondylosis without myelopathy or radiculopathy, thoracic region: Secondary | ICD-10-CM | POA: Diagnosis not present

## 2022-03-25 DIAGNOSIS — R200 Anesthesia of skin: Secondary | ICD-10-CM | POA: Diagnosis not present

## 2022-03-25 MED ORDER — IOPAMIDOL (ISOVUE-300) INJECTION 61%
80.0000 mL | Freq: Once | INTRAVENOUS | Status: AC | PRN
  Administered 2022-03-25: 80 mL via INTRAVENOUS

## 2022-03-30 ENCOUNTER — Ambulatory Visit: Payer: Medicare Other | Admitting: Cardiovascular Disease

## 2022-04-01 ENCOUNTER — Ambulatory Visit: Payer: Medicare Other | Admitting: Cardiovascular Disease

## 2022-04-27 ENCOUNTER — Ambulatory Visit: Payer: Medicare Other | Admitting: Cardiovascular Disease

## 2022-04-27 ENCOUNTER — Encounter: Payer: Self-pay | Admitting: Cardiovascular Disease

## 2022-04-27 VITALS — BP 130/82 | HR 89 | Ht 64.0 in | Wt 220.2 lb

## 2022-04-27 DIAGNOSIS — E119 Type 2 diabetes mellitus without complications: Secondary | ICD-10-CM | POA: Diagnosis not present

## 2022-04-27 DIAGNOSIS — E782 Mixed hyperlipidemia: Secondary | ICD-10-CM

## 2022-04-27 DIAGNOSIS — Z9884 Bariatric surgery status: Secondary | ICD-10-CM

## 2022-04-27 DIAGNOSIS — Z794 Long term (current) use of insulin: Secondary | ICD-10-CM | POA: Diagnosis not present

## 2022-04-27 DIAGNOSIS — R635 Abnormal weight gain: Secondary | ICD-10-CM

## 2022-04-27 DIAGNOSIS — E6609 Other obesity due to excess calories: Secondary | ICD-10-CM

## 2022-04-27 DIAGNOSIS — R0789 Other chest pain: Secondary | ICD-10-CM | POA: Diagnosis not present

## 2022-04-27 DIAGNOSIS — D569 Thalassemia, unspecified: Secondary | ICD-10-CM

## 2022-04-27 DIAGNOSIS — E7800 Pure hypercholesterolemia, unspecified: Secondary | ICD-10-CM

## 2022-04-27 DIAGNOSIS — Z6837 Body mass index (BMI) 37.0-37.9, adult: Secondary | ICD-10-CM

## 2022-04-27 DIAGNOSIS — R072 Precordial pain: Secondary | ICD-10-CM | POA: Diagnosis not present

## 2022-04-27 DIAGNOSIS — D573 Sickle-cell trait: Secondary | ICD-10-CM

## 2022-04-27 LAB — BASIC METABOLIC PANEL
BUN/Creatinine Ratio: 11 — ABNORMAL LOW (ref 12–28)
BUN: 14 mg/dL (ref 8–27)
CO2: 26 mmol/L (ref 20–29)
Calcium: 10.3 mg/dL (ref 8.7–10.3)
Chloride: 106 mmol/L (ref 96–106)
Creatinine, Ser: 1.23 mg/dL — ABNORMAL HIGH (ref 0.57–1.00)
Glucose: 81 mg/dL (ref 70–99)
Potassium: 5.4 mmol/L — ABNORMAL HIGH (ref 3.5–5.2)
Sodium: 145 mmol/L — ABNORMAL HIGH (ref 134–144)
eGFR: 46 mL/min/{1.73_m2} — ABNORMAL LOW (ref >59)

## 2022-04-27 MED ORDER — METOPROLOL TARTRATE 100 MG PO TABS: 100.0000 mg | ORAL_TABLET | Freq: Once | ORAL | 0 refills | Status: DC

## 2022-04-27 NOTE — Progress Notes (Signed)
Cardiology Office Note    Date:  04/30/2022   ID:  Barbara Mcdonald, Barbara Mcdonald [birth year], MRN [id number]  PCP:  Seward Carol, MD  Cardiologist:  Shelva Majestic, MD   New cardiology evaluation referred by Dr. Delfina Redwood for evaluation of chest pain   History of Present Illness:  Barbara Mcdonald is a 76 y.o. female who is followed by Dr. Seward Carol for primary care.  She has a history of sickle cell trait and thalassemia and has a history of prior morbid obesity.  In 2008 she underwent gastric bypass surgery by Dr. Kaylyn Lim.  Prior to surgery her weight was in excess of 300 pounds and following surgery she lost approximately 150 pounds.  Subsequently she has gained some of the weight back and now weighs around 220 pounds.  Recently, she has developed upper chest pressure which at times can radiate to her left arm and be associated with numbness.  These typically occur at rest and oftentimes may occur when she goes to bed.  Her episodes last for approximately 3 to 5 minutes.  She denies any exertional precipitation.  She denies any symptoms particularly with walking.  Presently, she has been on ramipril 2.5 mg for hypertension and simvastatin 20 mg for hyperlipidemia.  She is diabetic on metformin and Ozempic.  Because of her recent symptomatology she is now referred for cardiology evaluation.  Past Medical History:  Diagnosis Date   Anemia    Arthritis    Diabetes mellitus without complication ([place])    Obesity     Past Surgical History:  Procedure Laterality Date   BREAST BIOPSY Right 2016   BREAST SURGERY Bilateral years ago   reduction   COLONOSCOPY WITH PROPOFOL N/A 11/11/2014   Procedure: COLONOSCOPY WITH PROPOFOL;  Surgeon: Garlan Fair, MD;  Location: [facility];  Service: Endoscopy;  Laterality: N/A;   DILATION AND CURETTAGE OF UTERUS  yrs ago   GASTRIC BYPASS  4-5 years ago   REDUCTION MAMMAPLASTY Bilateral     Current Medications: Outpatient Medications Prior to Visit   Medication Sig Dispense Refill   Calcium Carbonate-Vitamin D (CALTRATE 600+D PO) Take 1 tablet by mouth 2 (two) times daily.      cetirizine (ZYRTEC) 5 MG tablet Take 5 mg by mouth every evening.      glucosamine-chondroitin 500-400 MG tablet Take 1 tablet by mouth 2 (two) times daily.     glucose blood (ONETOUCH VERIO) test strip Use as instructed to check blood sugars 2 times per day dx code E11.9 100 each 2   KRILL OIL PO Take 1 tablet by mouth daily.     metFORMIN (GLUCOPHAGE-XR) 750 MG 24 hr tablet TAKE 2 TABLETS(1500 MG) BY MOUTH DAILY 180 tablet 0   Multiple Vitamins-Minerals (MULTIVITAMIN & MINERAL PO) Take 1 tablet by mouth daily.      omeprazole (PRILOSEC) 20 MG capsule Take 20 mg by mouth every evening.      ONETOUCH DELICA LANCETS FINE MISC Use to check blood sugar 2 times per day dx code E11.9 100 each 2   ramipril (ALTACE) 2.5 MG capsule TK 1 C PO QD     Semaglutide,0.25 or 0.5MG/DOS, (OZEMPIC, 0.25 OR 0.5 MG/DOSE,) 2 MG/1.5ML SOPN 1 mg     simvastatin (ZOCOR) 20 MG tablet TAKE 1 TABLET(20 MG) BY MOUTH DAILY 90 tablet 0   vitamin B-12 (CYANOCOBALAMIN) 1000 MCG tablet Take 1,000 mcg by mouth daily.     ferrous fumarate (HEMOCYTE - 106 MG FE)  325 (106 FE) MG TABS tablet Take 1 tablet by mouth daily.     aspirin 81 MG tablet Take 81 mg by mouth daily.     INVOKANA 300 MG TABS tablet Take 1 tablet (300 mg total) by mouth daily before breakfast. 90 tablet 2   No facility-administered medications prior to visit.     Allergies:   Aspirin   Social History   Socioeconomic History   Marital status: Single    Spouse name: Not on file   Number of children: Not on file   Years of education: Not on file   Highest education level: Not on file  Occupational History   Not on file  Tobacco Use   Smoking status: Former    Packs/day: 0.25    Years: 3.00    Total pack years: 0.75    Types: Cigarettes   Smokeless tobacco: Never  Substance and Sexual Activity   Alcohol use: No    Drug use: No   Sexual activity: Not on file  Other Topics Concern   Not on file  Social History Narrative   Not on file   Social Determinants of Health   Financial Resource Strain: Not on file  Food Insecurity: Not on file  Transportation Needs: Not on file  Physical Activity: Not on file  Stress: Not on file  Social Connections: Not on file    Socially she is single.  She lives alone.  She is retired but previously had worked at both [employer] and [employer].  She also had worked for the city of [place] for 29 years.  She smoked for 3 years and quit in 1972.  Previously she used to do water aerobics.  She does not do any recent exercise.  Family History:  The patient's family history includes Diabetes in her brother and mother; Hypertension in her brother; Obesity in an other family member.   Both parents are deceased.  Mother had colon cancer and died at 73.  Father died at age 55 with prostate cancer.  She has 1 living brother and 3 deceased brothers, 2 living sisters and one deceased sister.  Her living brother is 43 and has heart disease and has previously undergone stenting.  ROS General: Negative; No fevers, chills, or night sweats;  HEENT: Negative; No changes in vision or hearing, sinus congestion, difficulty swallowing Pulmonary: Negative; No cough, wheezing, shortness of breath, hemoptysis Cardiovascular: Negative; No chest pain, presyncope, syncope, palpitations GI: Negative; No nausea, vomiting, diarrhea, or abdominal pain GU: Negative; No dysuria, hematuria, or difficulty voiding Musculoskeletal: Negative; no myalgias, joint pain, or weakness Hematologic/Oncology: Sickle cell trait and thalassemia, most recent MCV 68 Endocrine: Negative; no heat/cold intolerance; no diabetes Neuro: Negative; no changes in balance, headaches Skin: Negative; No rashes or skin lesions Psychiatric: Negative; No behavioral problems,  depression Sleep: Negative; No snoring, daytime sleepiness, hypersomnolence, bruxism, restless legs, hypnogognic hallucinations, no cataplexy Other comprehensive 14 point system review is negative.   PHYSICAL EXAM:   VS:  BP 130/82   Pulse 89   Ht _0  (1.626 m)   Wt 220 lb 3.2 oz (99.9 kg)   SpO2 98%   BMI 37.80 kg/m     Repeat blood pressure by me was 122/68  Wt Readings from Last 3 Encounters:  04/27/22 220 lb 3.2 oz (99.9 kg)  03/04/16 240 lb (108.9 kg)  12/01/15 247 lb 12.8 oz (112.4 kg)    General: Alert, oriented, no  distress.  Skin: normal turgor, no rashes, warm and dry HEENT: Normocephalic, atraumatic. Pupils equal round and reactive to light; sclera anicteric; extraocular muscles intact;  Nose without nasal septal hypertrophy Mouth/Parynx benign; Mallinpatti scale 3/4 Neck: No JVD, no carotid bruits; normal carotid upstroke Lungs: clear to ausculatation and percussion; no wheezing or rales Chest wall: without tenderness to palpitation Heart: PMI not displaced, RRR, s1 s2 normal, 1/6 systolic murmur, no diastolic murmur, no rubs, gallops, thrills, or heaves Abdomen: soft, nontender; no hepatosplenomehaly, BS+; abdominal aorta nontender and not dilated by palpation. Back: no CVA tenderness Pulses 2+ Musculoskeletal: full range of motion, normal strength, no joint deformities Extremities: no clubbing cyanosis or edema, Homan's sign negative  Neurologic: grossly nonfocal; Cranial nerves grossly wnl Psychologic: Normal mood and affect   Studies/Labs Reviewed:  April 27, 2022 ECG (independently read by me): NSR at 89, LVH by voltage PRWP anteriorly  Recent Labs:    Latest Ref Rng & Units 04/27/2022   11:14 AM 03/01/2016   10:03 AM 11/19/2015   10:21 AM  BMP  Glucose 70 - 99 mg/dL 81  104  115   BUN 8 - 27 mg/dL _0 Creatinine 0.57 - 1.00 mg/dL 1.23  1.30  1.05   BUN/Creat Ratio 12 - 28 11     Sodium 134 - 144 mmol/L 145  141  142   Potassium 3.5 -  5.2 mmol/L 5.4  4.3  4.6   Chloride 96 - 106 mmol/L 106  110  106   CO2 20 - 29 mmol/L _1 Calcium 8.7 - 10.3 mg/dL 10.3  9.7  9.6         Latest Ref Rng & Units 03/01/2016   10:03 AM 11/19/2015   10:21 AM 11/18/2014    9:45 AM  Hepatic Function  Total Protein 6.0 - 8.3 g/dL 7.3  7.3  7.2   Albumin 3.5 - 5.2 g/dL 3.9  3.9  4.0   AST 0 - 37 U/L _2 ALT 0 - 35 U/L _3 Alk Phosphatase 39 - 117 U/L 105  106  122   Total Bilirubin 0.2 - 1.2 mg/dL 0.3  0.4  0.3        Latest Ref Rng & Units 10/21/2013   10:25 AM 09/13/2007    5:50 AM 09/12/2007    5:54 PM  CBC  WBC 4.5 - 10.5 K/uL 4.5  8.1    Hemoglobin 12.0 - 15.0 g/dL 11.4  11.3  11.4   Hematocrit 36.0 - 46.0 % 37.0  34.6  35.4   Platelets 150.0 - 400.0 K/uL 306.0  319     Lab Results  Component Value Date   MCV 72.4 (L) 10/21/2013   MCV 67.7 (L) 09/13/2007   MCV 67.7 (L) 09/12/2007   No results found for: "TSH" Lab Results  Component Value Date   HGBA1C 5.9 03/01/2016     BNP No results found for: "BNP"  ProBNP No results found for: "PROBNP"   Lipid Panel     Component Value Date/Time   CHOL 122 11/18/2014 0945   TRIG 113.0 11/18/2014 0945   HDL 41.00 11/18/2014 0945   CHOLHDL 3 11/18/2014 0945   VLDL 22.6 11/18/2014 0945   LDLCALC 80 12/30/2015 0000     RADIOLOGY: No results found.   Additional studies/ records that were reviewed today include:   I  have reviewed the records of Dr. Jori Moll polite at [facility].  Most recent laboratory was reviewed.  Hemoglobin 10.7 hematocrit 33.7, MCV 68.6. Troponin T 17.   ASSESSMENT:    1. Precordial pain   2. Atypical chest pain   3. Mixed hyperlipidemia   4. Class 2 obesity due to excess calories with body mass index (BMI) of 37.0 to 37.9 in adult, unspecified whether serious comorbidity present   5. Type 2 diabetes mellitus without complication, with long-term current use of insulin ([place])   6. Weight gain following gastric bypass surgery    7. Thalassemia, unspecified type   8. Sickle cell trait [facility])     PLAN:  Barbara Mcdonald is a very pleasant 76 year old African-American female who has a history of sickle cell trait and thalassemia.  She has a history of remote morbid obesity and had weighed in excess of 300 and pounds.  Approximately 15 years ago in 2008 she underwent successful gastric bypass surgery by Dr. Kaylyn Lim and lost approximately 150 pounds.  Her weight has subsequently increased back to 220 pounds.  She has a history of diabetes mellitus hypertension, and hyperlipidemia.  She had recently developed somewhat atypical chest pain which she describes as as occurring in her upper chest and as a pressure but at times she has noted some left arm numbness.  Her symptoms occur typically at rest often when she is either going to bed or sitting in a chair and she denies any exertional component.  The episodes may last up to 3 to 5 minutes.  She is uncertain if it feels like she is having GERD reflux.  Her ECG shows normal sinus rhythm with voltage criteria for LVH and poor R wave progression.  On exam today blood pressure is stable on her regimen of low-dose ramipril 2.5 mg daily.  She is on simvastatin 20 mg daily for hyperlipidemia.  She is diabetic on metformin and Ozempic.  Presently, I am recommending she undergo an echo Doppler study to evaluate her LV systolic and diastolic function, valvular architecture, and assess for LVH.  With her chest pain, and body habitus, I am recommending she undergo coronary CTA for evaluation of CAD with calcium score and to assess for luminal obstruction to her coronary arteries.  I reviewed recent laboratory which showed lipid studies well controlled with total cholesterol at 132 HDL 39 LDL 63 although triglycerides were increased at 182.  Hemoglobin A1c was 6.2.  I will see her in in follow-up of the above studies in approximately 6 to 8 weeks and further evaluation will be made at that  time.   Medication Adjustments/Labs and Tests Ordered: Current medicines are reviewed at length with the patient today.  Concerns regarding medicines are outlined above.  Medication changes, Labs and Tests ordered today are listed in the Patient Instructions below. Patient Instructions  Medication Instructions:  Your physician recommends that you continue on your current medications as directed. Please refer to the Current Medication list given to you today.  *If you need a refill on your cardiac medications before your next appointment, please call your pharmacy*   Lab Work: Your physician recommends that you have labs drawn today: BMET   If you have labs (blood work) drawn today and your tests are completely normal, you will receive your results only by: [place] (if you have MyChart) OR A paper copy in the mail If you have any lab test that is abnormal or we need to change your  treatment, we will call you to review the results.   Testing/Procedures: Your physician has requested that you have an echocardiogram. Echocardiography is a painless test that uses sound waves to create images of your heart. It provides your doctor with information about the size and shape of your heart and how well your heart's chambers and valves are working. This procedure takes approximately one hour. There are no restrictions for this procedure.  This procedure will be done at [facility], 2nd floor   Follow-Up: At [facility], you and your health needs are our priority.  As part of our continuing mission to provide you with exceptional heart care, we have created designated Provider Care Teams.  These Care Teams include your primary Cardiologist (physician) and Advanced Practice Providers (APPs -  Physician Assistants and Nurse Practitioners) who all work together to provide you with the care you need, when you need it.  We recommend signing up for the patient portal called "MyChart".   Sign up information is provided on this After Visit Summary.  MyChart is used to connect with patients for Virtual Visits (Telemedicine).  Patients are able to view lab/test results, encounter notes, upcoming appointments, etc.  Non-urgent messages can be sent to your provider as well.   To learn more about what you can do with MyChart, go to [email or web address].    Your next appointment:   2 month(s)  The format for your next appointment:   In Person  Provider:   Shelva Majestic, MD    Other Instructions   Your cardiac CT will be scheduled at the below location:   [facility] [address] [place], [place] [address] [phone]   If scheduled at [facility], please arrive at the [facility] and Children's Entrance (Entrance C2) of [facility] 30 minutes prior to test start time. You can use the FREE valet parking offered at entrance C (encouraged to control the heart rate for the test)  Proceed to the [facility] Radiology Department (first floor) to check-in and test prep.  All radiology patients and guests should use entrance C2 at [facility], accessed from [facility], even though the hospital's physical address listed is [address]     Please follow these instructions carefully (unless otherwise directed):   On the Night Before the Test: Be sure to Drink plenty of water. Do not consume any caffeinated/decaffeinated beverages or chocolate 12 hours prior to your test. Do not take any antihistamines 12 hours prior to your test.  On the Day of the Test: Drink plenty of water until 1 hour prior to the test. Do not eat any food 4 hours prior to the test. You may take your regular medications prior to the test.  Take metoprolol (Lopressor) 188m two hours prior to test. FEMALES- please wear underwire-free bra if available, avoid dresses & tight clothing       After the Test: Drink plenty of water. After  receiving IV contrast, you may experience a mild flushed feeling. This is normal. On occasion, you may experience a mild rash up to 24 hours after the test. This is not dangerous. If this occurs, you can take Benadryl 25 mg and increase your fluid intake. If you experience trouble breathing, this can be serious. If it is severe call 911 IMMEDIATELY. If it is mild, please call our office. If you take any of these medications: Glipizide/Metformin, Avandament, Glucavance, please do not take 48 hours after  completing test unless otherwise instructed.  We will call to schedule your test 2-4 weeks out understanding that some insurance companies will need an authorization prior to the service being performed.   For non-scheduling related questions, please contact the cardiac imaging nurse navigator should you have any questions/concerns: Marchia Bond, Cardiac Imaging Nurse Navigator Gordy Clement, Cardiac Imaging Nurse Navigator [facility] Direct Office Dial: [phone]   For scheduling needs, including cancellations and rescheduling, please call [place], [phone].    Signed, Shelva Majestic, MD  04/30/2022 4:47 PM    [facility] [address], [place], [place], [place]  [address] Phone: [phone]

## 2022-04-27 NOTE — Patient Instructions (Addendum)
Medication Instructions:  Your physician recommends that you continue on your current medications as directed. Please refer to the Current Medication list given to you today.  *If you need a refill on your cardiac medications before your next appointment, please call your pharmacy*   Lab Work: Your physician recommends that you have labs drawn today: BMET   If you have labs (blood work) drawn today and your tests are completely normal, you will receive your results only by: MyChart Message (if you have MyChart) OR A paper copy in the mail If you have any lab test that is abnormal or we need to change your treatment, we will call you to review the results.   Testing/Procedures: Your physician has requested that you have an echocardiogram. Echocardiography is a painless test that uses sound waves to create images of your heart. It provides your doctor with information about the size and shape of your heart and how well your heart's chambers and valves are working. This procedure takes approximately one hour. There are no restrictions for this procedure.  This procedure will be done at [facility], 2nd floor   Follow-Up: At [facility], you and your health needs are our priority.  As part of our continuing mission to provide you with exceptional heart care, we have created designated Provider Care Teams.  These Care Teams include your primary Cardiologist (physician) and Advanced Practice Providers (APPs -  Physician Assistants and Nurse Practitioners) who all work together to provide you with the care you need, when you need it.  We recommend signing up for the patient portal called "MyChart".  Sign up information is provided on this After Visit Summary.  MyChart is used to connect with patients for Virtual Visits (Telemedicine).  Patients are able to view lab/test results, encounter notes, upcoming appointments, etc.  Non-urgent messages can be sent to your provider as well.   To  learn more about what you can do with MyChart, go to [email or web address].    Your next appointment:   2 month(s)  The format for your next appointment:   In Person  Provider:   Nicki Guadalajara, MD    Other Instructions   Your cardiac CT will be scheduled at the below location:   [facility] [address] [place], [place] [address] [phone]   If scheduled at [facility], please arrive at the [facility] and Children's Entrance (Entrance C2) of [facility] 30 minutes prior to test start time. You can use the FREE valet parking offered at entrance C (encouraged to control the heart rate for the test)  Proceed to the [facility] Radiology Department (first floor) to check-in and test prep.  All radiology patients and guests should use entrance C2 at [facility], accessed from [facility], even though the hospital's physical address listed is [address].     Please follow these instructions carefully (unless otherwise directed):   On the Night Before the Test: Be sure to Drink plenty of water. Do not consume any caffeinated/decaffeinated beverages or chocolate 12 hours prior to your test. Do not take any antihistamines 12 hours prior to your test.  On the Day of the Test: Drink plenty of water until 1 hour prior to the test. Do not eat any food 4 hours prior to the test. You may take your regular medications prior to the test.  Take metoprolol (Lopressor) 100mg  two hours prior to test. FEMALES- please wear underwire-free bra  if available, avoid dresses & tight clothing       After the Test: Drink plenty of water. After receiving IV contrast, you may experience a mild flushed feeling. This is normal. On occasion, you may experience a mild rash up to 24 hours after the test. This is not dangerous. If this occurs, you can take Benadryl 25 mg and increase your fluid intake. If you experience trouble breathing,  this can be serious. If it is severe call 911 IMMEDIATELY. If it is mild, please call our office. If you take any of these medications: Glipizide/Metformin, Avandament, Glucavance, please do not take 48 hours after completing test unless otherwise instructed.  We will call to schedule your test 2-4 weeks out understanding that some insurance companies will need an authorization prior to the service being performed.   For non-scheduling related questions, please contact the cardiac imaging nurse navigator should you have any questions/concerns: Rockwell Alexandria, Cardiac Imaging Nurse Navigator Larey Brick, Cardiac Imaging Nurse Navigator [facility] Direct Office Dial: [phone]   For scheduling needs, including cancellations and rescheduling, please call [place], [phone].

## 2022-04-28 ENCOUNTER — Telehealth (HOSPITAL_BASED_OUTPATIENT_CLINIC_OR_DEPARTMENT_OTHER): Payer: Self-pay | Admitting: Cardiovascular Disease

## 2022-04-28 NOTE — Telephone Encounter (Signed)
Left message for patient to call and discuss scheduling the Echocardiogram ordered by Dr. Tresa Endo

## 2022-04-29 ENCOUNTER — Other Ambulatory Visit: Payer: Self-pay

## 2022-04-29 DIAGNOSIS — E875 Hyperkalemia: Secondary | ICD-10-CM

## 2022-04-30 ENCOUNTER — Encounter: Payer: Self-pay | Admitting: Cardiovascular Disease

## 2022-05-18 ENCOUNTER — Ambulatory Visit (INDEPENDENT_AMBULATORY_CARE_PROVIDER_SITE_OTHER): Payer: Medicare Other

## 2022-05-18 DIAGNOSIS — E782 Mixed hyperlipidemia: Secondary | ICD-10-CM | POA: Diagnosis not present

## 2022-05-18 DIAGNOSIS — R072 Precordial pain: Secondary | ICD-10-CM

## 2022-05-18 LAB — ECHOCARDIOGRAM COMPLETE
Area-P 1/2: 3.12 cm2
Calc EF: 38.8 %
P 1/2 time: 772 msec
S' Lateral: 2.47 cm
Single Plane A2C EF: 42 %
Single Plane A4C EF: 32 %

## 2022-05-23 ENCOUNTER — Telehealth (HOSPITAL_COMMUNITY): Payer: Self-pay | Admitting: *Deleted

## 2022-05-23 NOTE — Telephone Encounter (Signed)
Attempted to call patient regarding upcoming cardiac CT appointment. °Left message on voicemail with name and callback number ° °Barbara Moffa RN Navigator Cardiac Imaging °[facility] °[phone] Office °[phone] Cell ° °

## 2022-05-24 ENCOUNTER — Ambulatory Visit (HOSPITAL_COMMUNITY)
Admission: RE | Admit: 2022-05-24 | Discharge: 2022-05-24 | Disposition: A | Discharge status: Home / Self Care | Payer: Medicare Other | Source: Ambulatory Visit | Attending: Cardiovascular Disease | Admitting: Cardiovascular Disease

## 2022-05-24 DIAGNOSIS — R072 Precordial pain: Secondary | ICD-10-CM | POA: Diagnosis not present

## 2022-05-24 MED ORDER — METOPROLOL TARTRATE 5 MG/5ML IV SOLN
INTRAVENOUS | Status: AC
  Filled 2022-05-24: qty 10

## 2022-05-24 MED ORDER — METOPROLOL TARTRATE 5 MG/5ML IV SOLN
INTRAVENOUS | Status: AC
  Administered 2022-05-24: 10 mg via INTRAVENOUS
  Filled 2022-05-24: qty 10

## 2022-05-24 MED ORDER — DILTIAZEM HCL 25 MG/5ML IV SOLN: 10.0000 mg | Freq: Once | INTRAVENOUS | Status: DC

## 2022-05-24 MED ORDER — IOHEXOL 350 MG/ML SOLN
95.0000 mL | Freq: Once | INTRAVENOUS | Status: AC | PRN
  Administered 2022-05-24: 95 mL via INTRAVENOUS

## 2022-05-24 MED ORDER — METOPROLOL TARTRATE 5 MG/5ML IV SOLN: 10.0000 mg | INTRAVENOUS | Status: DC | PRN

## 2022-05-24 MED ORDER — NITROGLYCERIN 0.4 MG SL SUBL
0.8000 mg | SUBLINGUAL_TABLET | Freq: Once | SUBLINGUAL | Status: AC
  Administered 2022-05-24: 0.8 mg via SUBLINGUAL

## 2022-05-24 MED ORDER — NITROGLYCERIN 0.4 MG SL SUBL
SUBLINGUAL_TABLET | SUBLINGUAL | Status: AC
  Filled 2022-05-24: qty 2

## 2022-05-24 MED ORDER — DILTIAZEM HCL 25 MG/5ML IV SOLN
INTRAVENOUS | Status: AC
  Filled 2022-05-24: qty 5

## 2022-05-24 NOTE — Progress Notes (Signed)
Arrived for IV consult for IV start.  RN in room stated that they just got IV and IV team services are not needed at this time.

## 2022-06-03 ENCOUNTER — Other Ambulatory Visit: Payer: Self-pay

## 2022-06-03 DIAGNOSIS — R918 Other nonspecific abnormal finding of lung field: Secondary | ICD-10-CM

## 2022-06-22 DIAGNOSIS — E1165 Type 2 diabetes mellitus with hyperglycemia: Secondary | ICD-10-CM | POA: Diagnosis not present

## 2022-06-22 DIAGNOSIS — H04123 Dry eye syndrome of bilateral lacrimal glands: Secondary | ICD-10-CM | POA: Diagnosis not present

## 2022-06-22 DIAGNOSIS — H2513 Age-related nuclear cataract, bilateral: Secondary | ICD-10-CM | POA: Diagnosis not present

## 2022-06-22 DIAGNOSIS — H35033 Hypertensive retinopathy, bilateral: Secondary | ICD-10-CM | POA: Diagnosis not present

## 2022-06-22 IMAGING — MG DIGITAL SCREENING BILAT W/ TOMO W/ CAD
8 series · 8 of 24 positions shown · non-contrast
Comparison: Previous exam(s).

ACR Breast Density Category a: The breast tissue is almost entirely
fatty.

CLINICAL DATA: Screening.

EXAM:
DIGITAL SCREENING BILATERAL MAMMOGRAM WITH TOMO AND CAD

[L CC synth-2D]
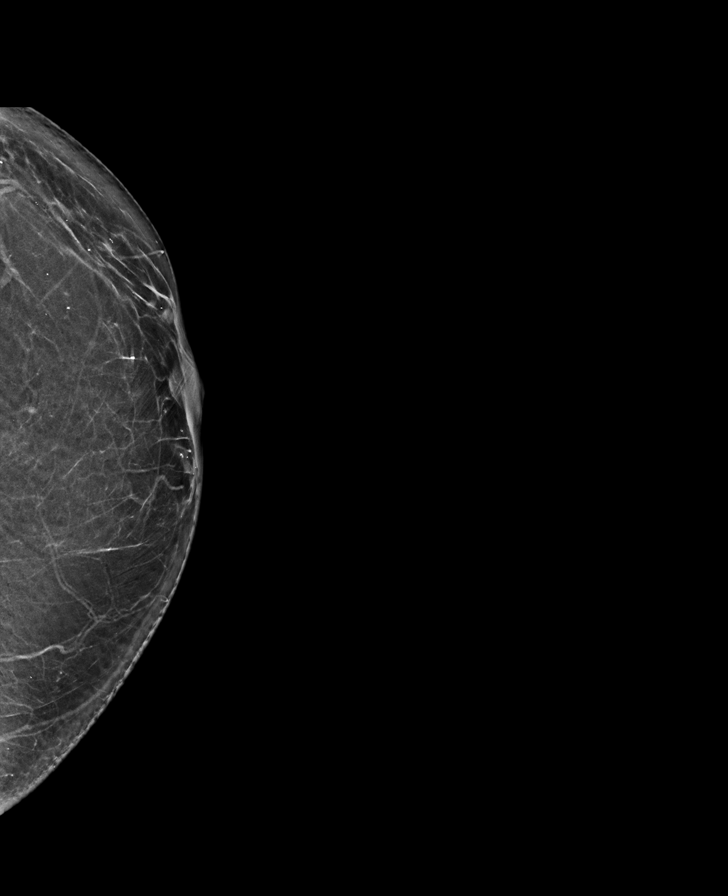

[R CC synth-2D]
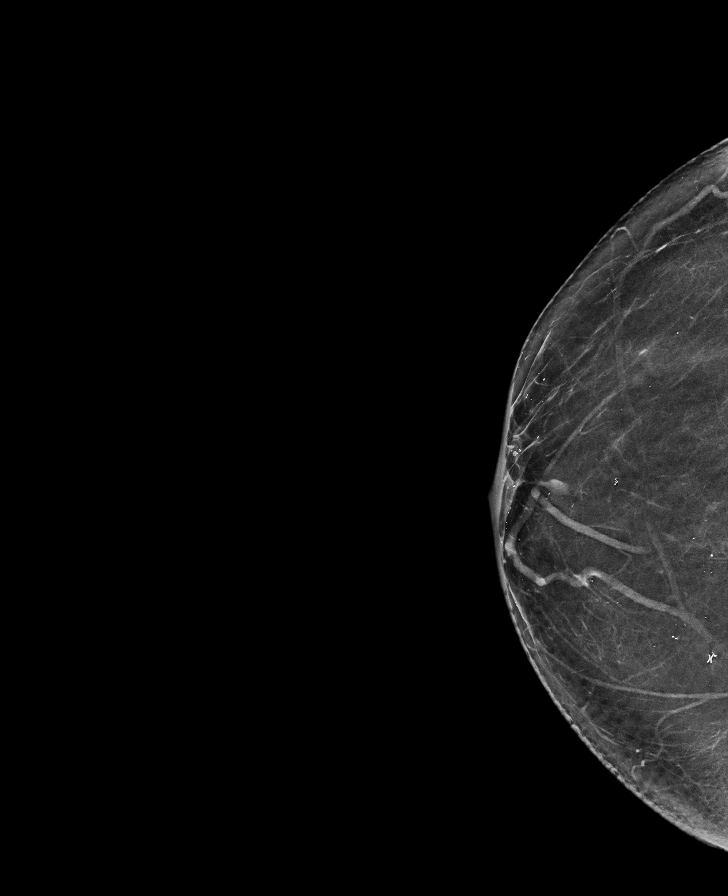

[R MLO synth-2D]
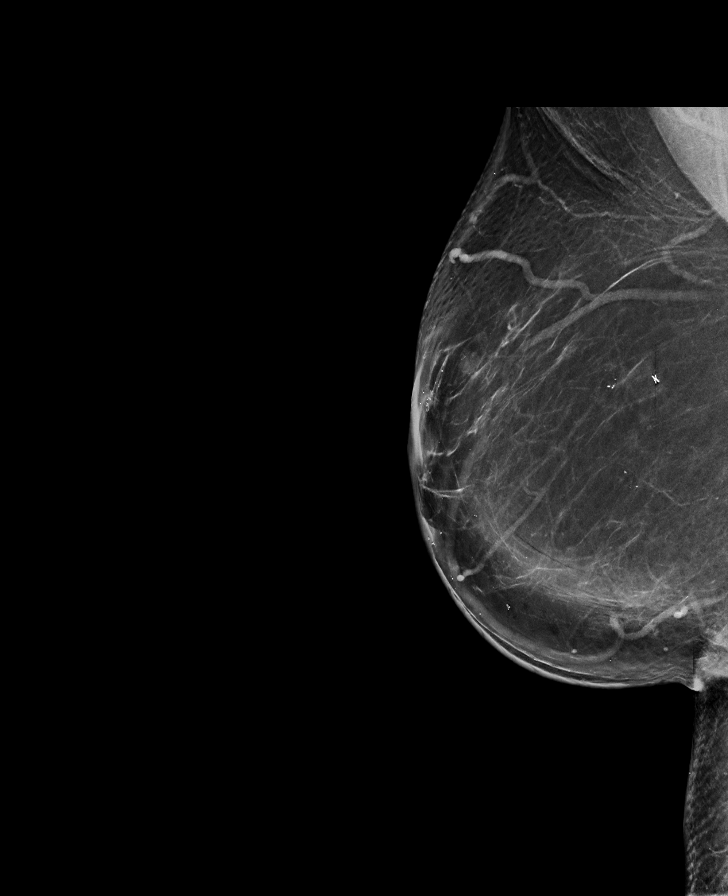

[L MLO synth-2D]
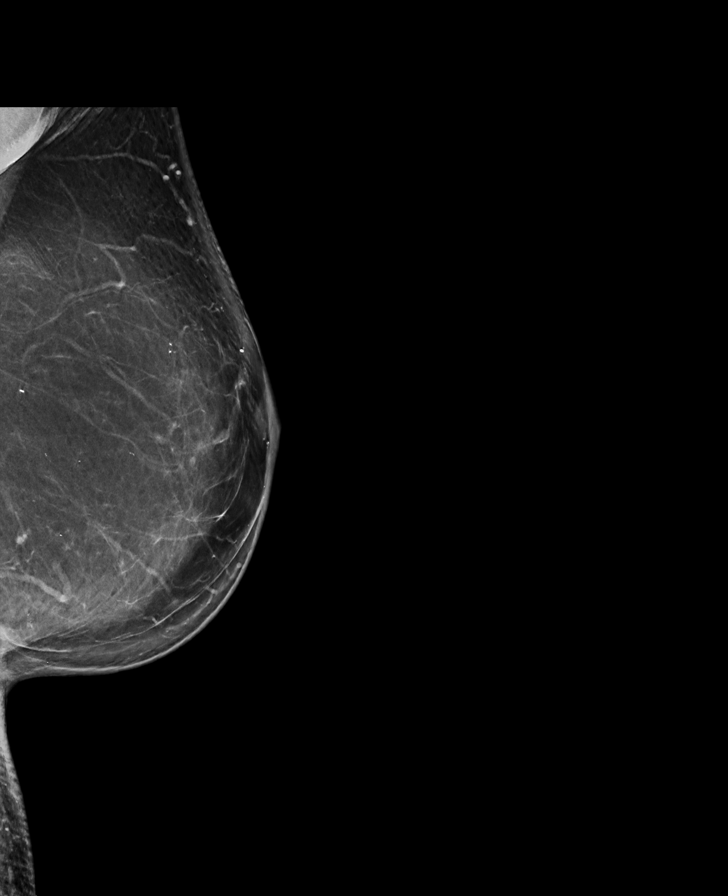

[R CC tomo · tomo slice 37/73.0]
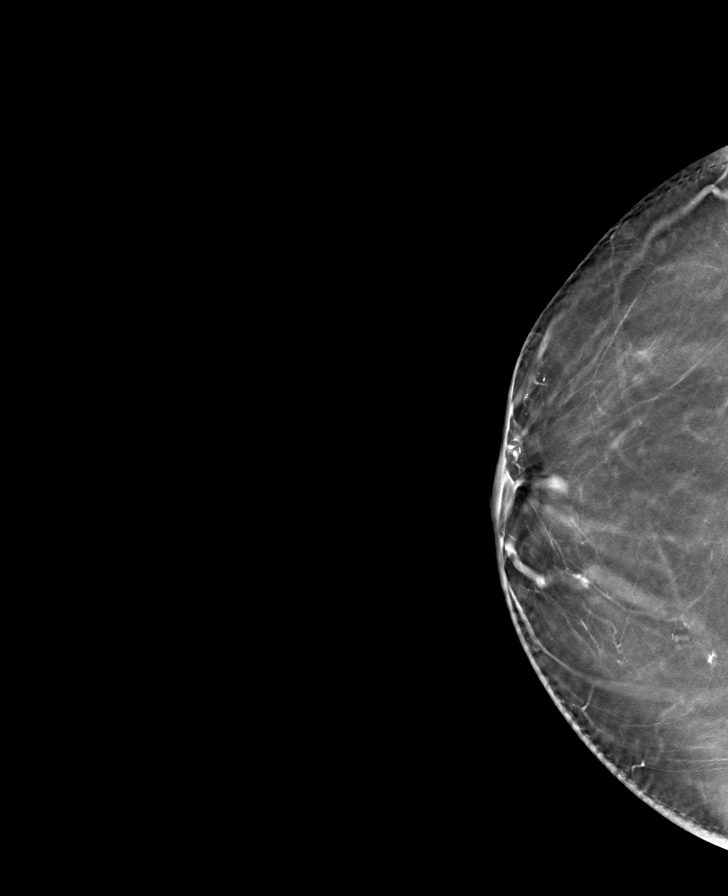

[R MLO tomo · tomo slice 44/87.0]
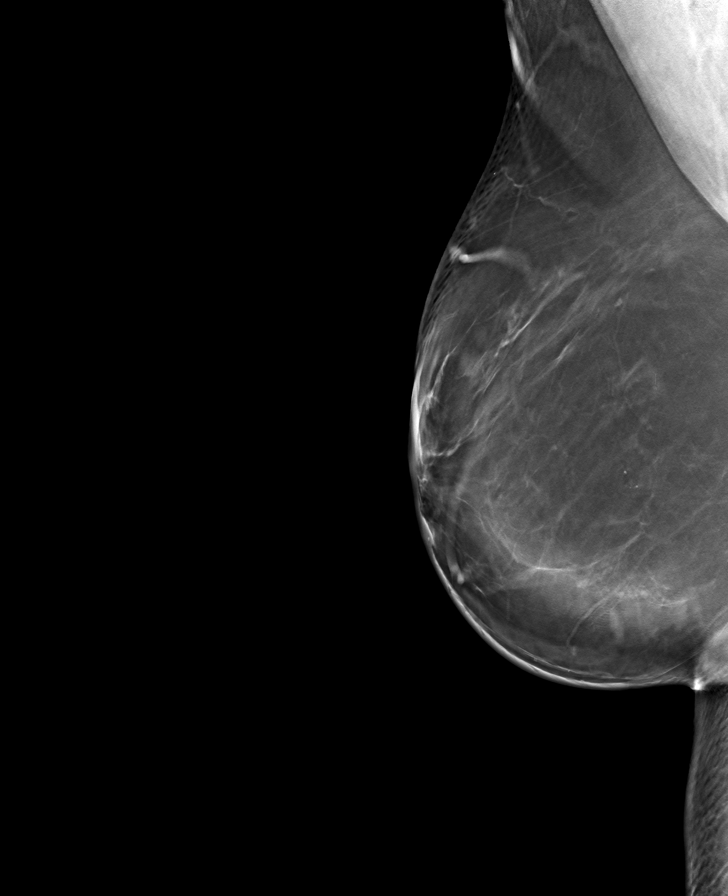

[L CC tomo · tomo slice 35/69.0]
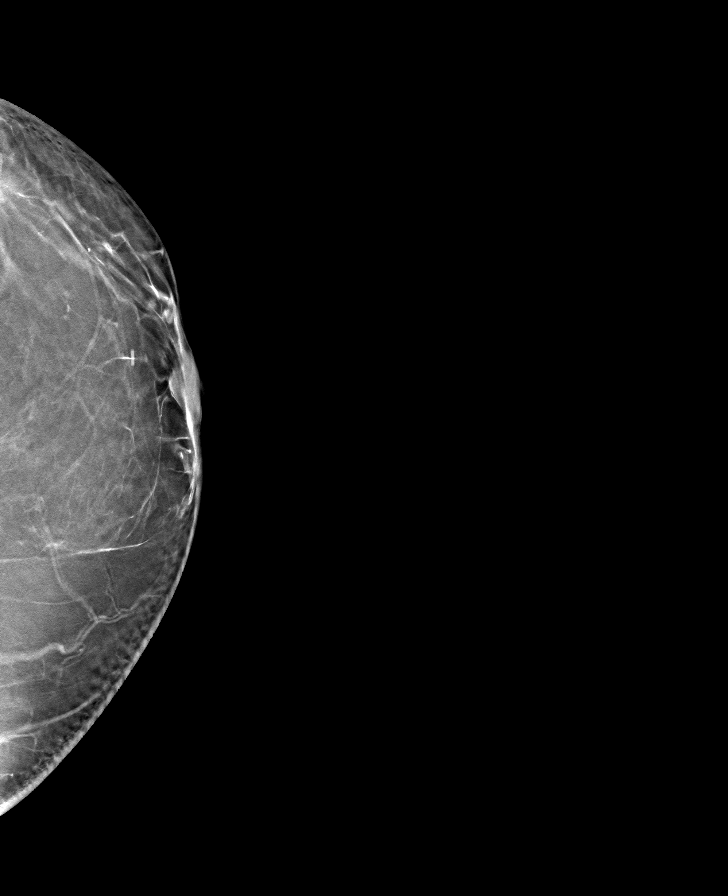

[L MLO tomo · tomo slice 44/87.0]
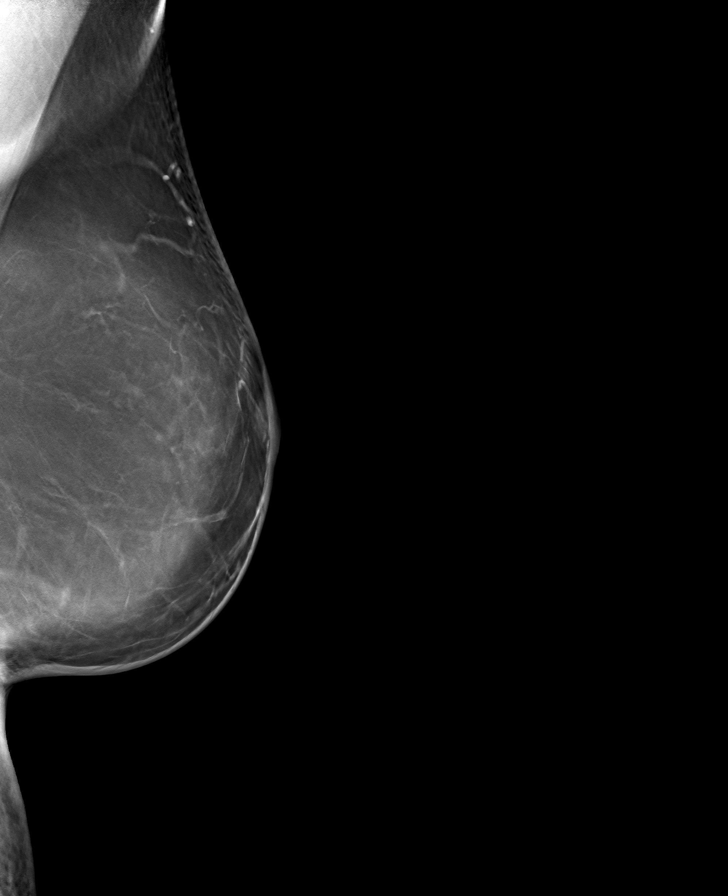

[8 of 24 positions shown; findings below may reference images not displayed]

FINDINGS: There are no findings suspicious for malignancy. Images were
processed with CAD.
IMPRESSION: No mammographic evidence of malignancy. A result letter of this
screening mammogram will be mailed directly to the patient.

RECOMMENDATION:
Screening mammogram in one year. (Code:[id number])

BI-RADS CATEGORY  1: Negative.

## 2022-06-23 ENCOUNTER — Ambulatory Visit: Payer: Medicare Other | Admitting: Cardiovascular Disease

## 2022-08-05 ENCOUNTER — Other Ambulatory Visit: Payer: Self-pay | Admitting: Internal Medicine

## 2022-08-05 DIAGNOSIS — Z1231 Encounter for screening mammogram for malignant neoplasm of breast: Secondary | ICD-10-CM

## 2022-09-29 DIAGNOSIS — Z23 Encounter for immunization: Secondary | ICD-10-CM | POA: Diagnosis not present

## 2022-09-29 DIAGNOSIS — H35033 Hypertensive retinopathy, bilateral: Secondary | ICD-10-CM | POA: Diagnosis not present

## 2022-09-29 DIAGNOSIS — N1831 Chronic kidney disease, stage 3a: Secondary | ICD-10-CM | POA: Diagnosis not present

## 2022-09-29 DIAGNOSIS — E7800 Pure hypercholesterolemia, unspecified: Secondary | ICD-10-CM | POA: Diagnosis not present

## 2022-09-29 DIAGNOSIS — E1169 Type 2 diabetes mellitus with other specified complication: Secondary | ICD-10-CM | POA: Diagnosis not present

## 2022-09-29 DIAGNOSIS — I10 Essential (primary) hypertension: Secondary | ICD-10-CM | POA: Diagnosis not present

## 2022-09-29 DIAGNOSIS — E0822 Diabetes mellitus due to underlying condition with diabetic chronic kidney disease: Secondary | ICD-10-CM | POA: Diagnosis not present

## 2022-09-29 DIAGNOSIS — Z0000 Encounter for general adult medical examination without abnormal findings: Secondary | ICD-10-CM | POA: Diagnosis not present

## 2022-09-29 DIAGNOSIS — R911 Solitary pulmonary nodule: Secondary | ICD-10-CM | POA: Diagnosis not present

## 2022-09-30 ENCOUNTER — Ambulatory Visit
Admission: RE | Admit: 2022-09-30 | Discharge: 2022-09-30 | Disposition: A | Discharge status: Home / Self Care | Payer: Medicare Other | Source: Ambulatory Visit | Attending: Internal Medicine | Admitting: Internal Medicine

## 2022-09-30 DIAGNOSIS — Z1231 Encounter for screening mammogram for malignant neoplasm of breast: Secondary | ICD-10-CM | POA: Diagnosis not present

## 2022-10-24 ENCOUNTER — Other Ambulatory Visit: Payer: Self-pay | Admitting: Internal Medicine

## 2022-10-24 DIAGNOSIS — E2839 Other primary ovarian failure: Secondary | ICD-10-CM

## 2022-12-06 ENCOUNTER — Other Ambulatory Visit: Payer: Medicare Other

## 2023-01-10 DIAGNOSIS — E1169 Type 2 diabetes mellitus with other specified complication: Secondary | ICD-10-CM | POA: Diagnosis not present

## 2023-01-10 DIAGNOSIS — E119 Type 2 diabetes mellitus without complications: Secondary | ICD-10-CM | POA: Diagnosis not present

## 2023-01-10 DIAGNOSIS — D5740 Sickle-cell thalassemia without crisis: Secondary | ICD-10-CM | POA: Diagnosis not present

## 2023-01-10 DIAGNOSIS — R911 Solitary pulmonary nodule: Secondary | ICD-10-CM | POA: Diagnosis not present

## 2023-01-10 DIAGNOSIS — I10 Essential (primary) hypertension: Secondary | ICD-10-CM | POA: Diagnosis not present

## 2023-01-10 DIAGNOSIS — E7800 Pure hypercholesterolemia, unspecified: Secondary | ICD-10-CM | POA: Diagnosis not present

## 2023-03-29 DIAGNOSIS — E119 Type 2 diabetes mellitus without complications: Secondary | ICD-10-CM | POA: Diagnosis not present

## 2023-03-29 DIAGNOSIS — H25813 Combined forms of age-related cataract, bilateral: Secondary | ICD-10-CM | POA: Diagnosis not present

## 2023-03-30 DIAGNOSIS — Z09 Encounter for follow-up examination after completed treatment for conditions other than malignant neoplasm: Secondary | ICD-10-CM | POA: Diagnosis not present

## 2023-03-30 DIAGNOSIS — K648 Other hemorrhoids: Secondary | ICD-10-CM | POA: Diagnosis not present

## 2023-03-30 DIAGNOSIS — Z86010 Personal history of colonic polyps: Secondary | ICD-10-CM | POA: Diagnosis not present

## 2023-03-30 DIAGNOSIS — D123 Benign neoplasm of transverse colon: Secondary | ICD-10-CM | POA: Diagnosis not present

## 2023-04-03 DIAGNOSIS — D123 Benign neoplasm of transverse colon: Secondary | ICD-10-CM | POA: Diagnosis not present

## 2023-05-12 ENCOUNTER — Ambulatory Visit
Admission: RE | Admit: 2023-05-12 | Discharge: 2023-05-12 | Disposition: A | Discharge status: Home / Self Care | Payer: Medicare (Managed Care) | Source: Ambulatory Visit | Attending: Internal Medicine | Admitting: Internal Medicine

## 2023-05-12 DIAGNOSIS — M8588 Other specified disorders of bone density and structure, other site: Secondary | ICD-10-CM | POA: Diagnosis not present

## 2023-05-12 DIAGNOSIS — E349 Endocrine disorder, unspecified: Secondary | ICD-10-CM | POA: Diagnosis not present

## 2023-05-12 DIAGNOSIS — E2839 Other primary ovarian failure: Secondary | ICD-10-CM

## 2023-05-12 DIAGNOSIS — N958 Other specified menopausal and perimenopausal disorders: Secondary | ICD-10-CM | POA: Diagnosis not present

## 2023-05-26 ENCOUNTER — Telehealth: Payer: Self-pay | Admitting: *Deleted

## 2023-05-26 ENCOUNTER — Telehealth: Payer: Self-pay | Admitting: Cardiovascular Disease

## 2023-05-26 NOTE — Telephone Encounter (Signed)
LMOVM returning call. Advised will make additional attempt to reach pt early next week.

## 2023-05-26 NOTE — Telephone Encounter (Signed)
See other phone note from 05/26/23.

## 2023-05-26 NOTE — Telephone Encounter (Signed)
patient is returning your phone call.

## 2023-05-26 NOTE — Telephone Encounter (Signed)
Received request from checkout staff to contact pt regarding reasoning for chest CT ordered by Dr. Tresa Endo. LMOVM (DPR) for pt requesting call back to discuss test. Main office number provided.  Per Dr. Landry Dyke result note from 05/24/22 cardiac CT, "Chest CT showed previously noted small bilateral pulmonary nodules.  Recommend noncontrast chest CT in 3 to 6 months." Chest CT was ordered for 3 months out with plan to discuss with pt at 06/23/22 OV, which was cancelled by pt. Will offer overdue OV appt to discuss further.

## 2023-05-30 NOTE — Telephone Encounter (Signed)
LMOVM requesting call back. Direct number provided.

## 2023-06-06 DIAGNOSIS — R5383 Other fatigue: Secondary | ICD-10-CM | POA: Diagnosis not present

## 2023-06-06 DIAGNOSIS — R6883 Chills (without fever): Secondary | ICD-10-CM | POA: Diagnosis not present

## 2023-06-06 DIAGNOSIS — Z20822 Contact with and (suspected) exposure to covid-19: Secondary | ICD-10-CM | POA: Diagnosis not present

## 2023-06-20 NOTE — Telephone Encounter (Signed)
Spoke with patient. She is agreeable to scheduling an appointment to discuss possible non-contrast chest CT and does not wish to proceed with the test until she has a better understanding of why it was ordered. Pt requested an appt in late November. Scheduled for 08/01/23 at 1:30pm with Juliane Lack, NP. Pt expressed appreciation of call and agreement with plan.

## 2023-08-01 ENCOUNTER — Ambulatory Visit: Payer: Medicare (Managed Care) | Attending: Nurse Practitioner | Admitting: Emergency Medicine

## 2023-08-01 ENCOUNTER — Encounter: Payer: Self-pay | Admitting: Nurse Practitioner

## 2023-08-01 VITALS — BP 122/76 | HR 80 | Ht 64.0 in | Wt 214.0 lb

## 2023-08-01 DIAGNOSIS — E785 Hyperlipidemia, unspecified: Secondary | ICD-10-CM

## 2023-08-01 DIAGNOSIS — R918 Other nonspecific abnormal finding of lung field: Secondary | ICD-10-CM

## 2023-08-01 DIAGNOSIS — I10 Essential (primary) hypertension: Secondary | ICD-10-CM

## 2023-08-01 DIAGNOSIS — I2510 Atherosclerotic heart disease of native coronary artery without angina pectoris: Secondary | ICD-10-CM

## 2023-08-01 NOTE — Patient Instructions (Signed)
Medication Instructions:  Your physician recommends that you continue on your current medications as directed. Please refer to the Current Medication list given to you today.  *If you need a refill on your cardiac medications before your next appointment, please call your pharmacy*   Lab Work: NONE ordered at this time of appointment    Testing/Procedures: NONE ordered at this time of appointment     Follow-Up: At [facility], you and your health needs are our priority.  As part of our continuing mission to provide you with exceptional heart care, we have created designated Provider Care Teams.  These Care Teams include your primary Cardiologist (physician) and Advanced Practice Providers (APPs -  Physician Assistants and Nurse Practitioners) who all work together to provide you with the care you need, when you need it.  We recommend signing up for the patient portal called "MyChart".  Sign up information is provided on this After Visit Summary.  MyChart is used to connect with patients for Virtual Visits (Telemedicine).  Patients are able to view lab/test results, encounter notes, upcoming appointments, etc.  Non-urgent messages can be sent to your provider as well.   To learn more about what you can do with MyChart, go to [email or web address].    Your next appointment:   1 year(s)  Provider:   Nicki Guadalajara, MD     Other Instructions

## 2023-08-01 NOTE — Progress Notes (Signed)
Cardiology Office Note:    Date:  08/01/2023  ID:  MARIELLY SPINALE, DOB February 20, 1946, MRN [id number] PCP: Renford Dills, MD  [facility] Providers Cardiologist:  Nicki Guadalajara, MD       Patient Profile:      Barbara Mcdonald is a 77 year old female with history of nonobstructive CAD, hyperlipidemia, hypertension, obesity, type 2 diabetes, thalassemia, sickle cell trait.  She established with cardiology on 04/27/2022 with Dr. Tresa Endo for chest pain.  She had developed upper chest pressure which at times can radiate to her left arm and be associated with numbness.  These symptoms typically occurred at rest and oftentimes occur when she goes to bed.  The episodes lasted approximately 3 to 5 minutes.  She had history of remote morbid obesity and weighed in excess of 300 pounds, underwent gastric bypass surgery in 2008 and lost approximately 150 pounds.  Her weight had subsequently increased back to 220 pounds during this visit.  It was recommended that she undergo echo Doppler study and coronary CTA.  Echocardiogram completed on 05/18/2022 showing LVEF 50-55%, no RWMA, mild LVH, grade 1 DD, RV SF normal, no valvular abnormalities.  Coronary CTA completed on 05/24/2022 showing coronary calcium score of 1.07, this was 40 percentile for age and sex, mild nonobstructive CAD with minimal (<24%) calcified plaque in the proximal LAD. Chest CT reading from the coronary CTA showed persistent multiple bilateral pulmonary nodules and recommended follow-up noncontrast CT in 3 to 6 months.      History of Present Illness:   Barbara Mcdonald is a 77 y.o. female who returns for her 1 year follow-up.  Today, she is doing well from a cardiac standpoint.  She is without any concerns and or complaints at this time.  Most notably she would like to discuss the results of her coronary CTA and echocardiogram from 2023. She had noted some confusion regarding scan results and follow-up test. She reports she desires to begin  working out again, she was doing water aerobics prior to COVID.  She is compliant with her medications and routinely follows up with her PCP Dr. Nehemiah Settle.  She denies any chest pain, shortness of breath, DOE, syncope, lightheadedness, orthopnea, and dizziness.        Review of Systems  Constitutional: Negative for weight gain and weight loss.  Cardiovascular:  Negative for chest pain, claudication, dyspnea on exertion, irregular heartbeat, leg swelling, near-syncope, orthopnea, palpitations, paroxysmal nocturnal dyspnea and syncope.  Respiratory:  Negative for cough, hemoptysis and shortness of breath.   Gastrointestinal:  Negative for abdominal pain, hematochezia and melena.  Genitourinary:  Negative for hematuria.  Neurological:  Negative for dizziness and light-headedness.     See HPI     Studies Reviewed:   EKG Interpretation Date/Time:  Tuesday August 01 2023 13:43:03 EST Ventricular Rate:  80 PR Interval:  164 QRS Duration:  80 QT Interval:  334 QTC Calculation: 385 R Axis:   -17  Text Interpretation: Normal sinus rhythm with sinus arrhythmia Minimal voltage criteria for LVH, may be normal variant Confirmed by Rise Paganini [phone]) on 08/01/2023 2:15:53 PM    Coronary CTA 05/24/2022 IMPRESSION: 1. Coronary calcium score of 1.07. This was 40 percentile for age and sex matched control.   2. Normal coronary origin with right dominance.   3. CAD-RADS 1. Minimal non-obstructive CAD (0-24%). Consider non-atherosclerotic causes of chest pain. Consider preventive therapy and risk factor modification.  Echocardiogram 05/18/2022 1. Left ventricular ejection fraction, by estimation, is 50 to 55%.  The  left ventricle has low normal function. The left ventricle has no regional  wall motion abnormalities. There is mild left ventricular hypertrophy.  Left ventricular diastolic  parameters are consistent with Grade I diastolic dysfunction (impaired  relaxation).   2. Right  ventricular systolic function is normal. The right ventricular  size is normal.   3. The mitral valve is normal in structure. No evidence of mitral valve  regurgitation.   4. The aortic valve is tricuspid. Aortic valve regurgitation is not  visualized.   5. The inferior vena cava is normal in size with greater than 50%  respiratory variability, suggesting right atrial pressure of 3 mmHg.   Risk Assessment/Calculations:            Physical Exam:   VS:  BP 122/76 (BP Location: Left Arm, Patient Position: Sitting, Cuff Size: Large)   Pulse 80   Ht 5\' 4"  (1.626 m)   Wt 214 lb (97.1 kg)   BMI 36.73 kg/m    Wt Readings from Last 3 Encounters:  08/01/23 214 lb (97.1 kg)  04/27/22 220 lb 3.2 oz (99.9 kg)  03/04/16 240 lb (108.9 kg)    Constitutional:      Appearance: Normal and healthy appearance.  HENT:     Head: Normocephalic.  Neck:     Vascular: JVD normal.  Pulmonary:     Effort: Pulmonary effort is normal.     Breath sounds: Normal breath sounds.  Chest:     Chest wall: Not tender to palpatation.  Cardiovascular:     PMI at left midclavicular line. Normal rate. Regular rhythm. Normal S1. Normal S2.      Murmurs: There is no murmur.     No gallop.  No click. No rub.  Pulses:    Intact distal pulses.  Edema:    Peripheral edema absent.  Musculoskeletal: Normal range of motion.     Cervical back: Normal range of motion and neck supple. Skin:    General: Skin is warm and dry.  Neurological:     General: No focal deficit present.     Mental Status: Alert, oriented to person, place, and time and oriented to person, place and time.  Psychiatric:        Attention and Perception: Attention and perception normal.        Mood and Affect: Mood normal.        Behavior: Behavior is cooperative.        Thought Content: Thought content normal.        Assessment and Plan:  Nonobstructive CAD -Coronary CTA 05/24/2022 shows minimal non-obstructive CAD (0-24%) to  LAD -Echocardiogram 05/18/2022 LVEF 50-5%, mild LVH, grade 1 DD -Her ECG shows normal sinus rhythm with voltage criteria for LVH  -Stable with no anginal symptoms, no indication for ischemic evaluation  -GDMT simvastatin 20mg   Hyperlipidemia, goal LDL<70 -01/10/2023 LDL 49, HDL 47, TC 125, triglycerides 166 -LDL at goal, continue simvastatin 20 mg -Encouraged heart healthy dieting, increase vegetables, fruit, fiber.  Decrease sugar and processed foods  Hypertension -BP today 122/76, well-controlled -Continue ramipril 2.5 mg once daily -Focus on daily exercise and weight loss  Pulmonary nodules -Noted on coronary CTA 05/24/2022 -Noted on CT angio chest ordered by her PCP on 03/25/2019 -She denies SOB, DOE, orthopnea, PND, fatigue -Recommend follow with PCP for ongoing monitoring of nodules noted on CT   T2DM -A1c 6.3 on 01/10/2023 -Follows with PCP  Dispo:  Return in about 1 year (around 07/31/2024).  Signed, Denyce Robert, NP

## 2023-09-08 ENCOUNTER — Other Ambulatory Visit: Payer: Self-pay | Admitting: Internal Medicine

## 2023-09-08 DIAGNOSIS — Z1231 Encounter for screening mammogram for malignant neoplasm of breast: Secondary | ICD-10-CM

## 2023-10-05 ENCOUNTER — Ambulatory Visit
Admission: RE | Admit: 2023-10-05 | Discharge: 2023-10-05 | Disposition: A | Discharge status: Home / Self Care | Payer: Medicare (Managed Care) | Source: Ambulatory Visit | Attending: Internal Medicine | Admitting: Internal Medicine

## 2023-10-05 DIAGNOSIS — Z1231 Encounter for screening mammogram for malignant neoplasm of breast: Secondary | ICD-10-CM

## 2023-12-01 DIAGNOSIS — M25569 Pain in unspecified knee: Secondary | ICD-10-CM | POA: Diagnosis not present

## 2023-12-01 DIAGNOSIS — M25561 Pain in right knee: Secondary | ICD-10-CM | POA: Diagnosis not present

## 2023-12-05 DIAGNOSIS — M25562 Pain in left knee: Secondary | ICD-10-CM | POA: Diagnosis not present

## 2023-12-05 DIAGNOSIS — M25561 Pain in right knee: Secondary | ICD-10-CM | POA: Diagnosis not present

## 2024-02-27 DIAGNOSIS — E1169 Type 2 diabetes mellitus with other specified complication: Secondary | ICD-10-CM | POA: Diagnosis not present

## 2024-02-27 DIAGNOSIS — I10 Essential (primary) hypertension: Secondary | ICD-10-CM | POA: Diagnosis not present

## 2024-03-04 DIAGNOSIS — I10 Essential (primary) hypertension: Secondary | ICD-10-CM | POA: Diagnosis not present

## 2024-03-04 DIAGNOSIS — E1169 Type 2 diabetes mellitus with other specified complication: Secondary | ICD-10-CM | POA: Diagnosis not present

## 2024-03-27 DIAGNOSIS — E1169 Type 2 diabetes mellitus with other specified complication: Secondary | ICD-10-CM | POA: Diagnosis not present

## 2024-03-27 DIAGNOSIS — I10 Essential (primary) hypertension: Secondary | ICD-10-CM | POA: Diagnosis not present

## 2024-04-04 DIAGNOSIS — M25531 Pain in right wrist: Secondary | ICD-10-CM | POA: Diagnosis not present

## 2024-04-04 DIAGNOSIS — I10 Essential (primary) hypertension: Secondary | ICD-10-CM | POA: Diagnosis not present

## 2024-04-04 DIAGNOSIS — E1169 Type 2 diabetes mellitus with other specified complication: Secondary | ICD-10-CM | POA: Diagnosis not present

## 2024-04-04 DIAGNOSIS — R202 Paresthesia of skin: Secondary | ICD-10-CM | POA: Diagnosis not present

## 2024-04-16 DIAGNOSIS — E7800 Pure hypercholesterolemia, unspecified: Secondary | ICD-10-CM | POA: Diagnosis not present

## 2024-04-16 DIAGNOSIS — E1169 Type 2 diabetes mellitus with other specified complication: Secondary | ICD-10-CM | POA: Diagnosis not present

## 2024-04-16 DIAGNOSIS — I10 Essential (primary) hypertension: Secondary | ICD-10-CM | POA: Diagnosis not present

## 2024-04-16 DIAGNOSIS — D5740 Sickle-cell thalassemia without crisis: Secondary | ICD-10-CM | POA: Diagnosis not present

## 2024-04-16 DIAGNOSIS — N1831 Chronic kidney disease, stage 3a: Secondary | ICD-10-CM | POA: Diagnosis not present

## 2024-05-05 DIAGNOSIS — E1169 Type 2 diabetes mellitus with other specified complication: Secondary | ICD-10-CM | POA: Diagnosis not present

## 2024-05-05 DIAGNOSIS — I10 Essential (primary) hypertension: Secondary | ICD-10-CM | POA: Diagnosis not present

## 2024-06-13 DIAGNOSIS — G5603 Carpal tunnel syndrome, bilateral upper limbs: Secondary | ICD-10-CM | POA: Diagnosis not present

## 2024-06-18 DIAGNOSIS — G5603 Carpal tunnel syndrome, bilateral upper limbs: Secondary | ICD-10-CM | POA: Diagnosis not present

## 2024-09-11 ENCOUNTER — Other Ambulatory Visit: Payer: Self-pay | Admitting: Internal Medicine

## 2024-09-11 DIAGNOSIS — Z1231 Encounter for screening mammogram for malignant neoplasm of breast: Secondary | ICD-10-CM

## 2024-10-08 ENCOUNTER — Ambulatory Visit: Payer: Medicare (Managed Care)

## 2024-10-18 ENCOUNTER — Ambulatory Visit: Payer: Medicare (Managed Care)
